# Patient Record
Sex: Male | Born: 1980 | Race: Black or African American | Hispanic: No | Marital: Single | Smoking: Former smoker
Health system: Southern US, Community
[De-identification: ages and names within clinical notes are randomized; demographics above are authoritative.]

## PROBLEM LIST (undated history)

## (undated) DIAGNOSIS — E119 Type 2 diabetes mellitus without complications: Secondary | ICD-10-CM

## (undated) DIAGNOSIS — E785 Hyperlipidemia, unspecified: Secondary | ICD-10-CM

## (undated) HISTORY — DX: Type 2 diabetes mellitus without complications: E11.9

## (undated) HISTORY — DX: Hyperlipidemia, unspecified: E78.5

---

## 2003-09-01 ENCOUNTER — Emergency Department (HOSPITAL_COMMUNITY): Admission: EM | Admit: 2003-09-01 | Discharge: 2003-09-02 | Payer: Self-pay | Admitting: Emergency Medicine

## 2012-10-06 ENCOUNTER — Ambulatory Visit (INDEPENDENT_AMBULATORY_CARE_PROVIDER_SITE_OTHER): Payer: BC Managed Care – PPO | Admitting: Family Medicine

## 2012-10-06 VITALS — BP 155/84 | HR 86 | Temp 97.9°F | Resp 16 | Ht 73.58 in | Wt 211.4 lb

## 2012-10-06 DIAGNOSIS — Z0000 Encounter for general adult medical examination without abnormal findings: Secondary | ICD-10-CM

## 2012-10-06 LAB — POCT CBC
Granulocyte percent: 44.7 %G (ref 37–80)
HCT, POC: 45.6 % (ref 43.5–53.7)
Hemoglobin: 14.6 g/dL (ref 14.1–18.1)
Lymph, poc: 3.6 — AB (ref 0.6–3.4)
MCH, POC: 27.5 pg (ref 27–31.2)
MCHC: 32 g/dL (ref 31.8–35.4)
MCV: 86.1 fL (ref 80–97)
MID (cbc): 0.5 (ref 0–0.9)
MPV: 9.7 fL (ref 0–99.8)
POC Granulocyte: 3.3 (ref 2–6.9)
POC LYMPH PERCENT: 49.2 %L (ref 10–50)
POC MID %: 6.1 %M (ref 0–12)
Platelet Count, POC: 277 10*3/uL (ref 142–424)
RBC: 5.3 M/uL (ref 4.69–6.13)
RDW, POC: 16.5 %
WBC: 7.4 10*3/uL (ref 4.6–10.2)

## 2012-10-06 NOTE — Progress Notes (Signed)
  Subjective:    Patient ID: Adrian Mcdaniel, male    DOB: September 21, 1981, 32 y.o.   MRN: [id number] Chief Complaint  Patient presents with  . Annual Exam    with dot pe    HPI  On doxy for scalp folliculitis from dermatologist in [place]. Fasting since breakfast.  No other pmhx No pshx No famhx. No cad, cva, cancre.    Review of Systems    118/88 L arm, 132/86 in Rt arm with large cuff BP 155/84  Pulse 86  Temp(Src) 97.9 F (36.6 C) (Oral)  Resp 16  Ht 6' 1.58" (1.869 m)  Wt 211 lb 6.4 oz (95.89 kg)  BMI 27.45 kg/m2  SpO2 98% Objective:   Physical Exam        Results for orders placed in visit on 10/06/12  POCT CBC      Component Value Range   WBC 7.4  4.6 - 10.2 K/uL   Lymph, poc 3.6 (*) 0.6 - 3.4   POC LYMPH PERCENT 49.2  10 - 50 %L   MID (cbc) 0.5  0 - 0.9   POC MID % 6.1  0 - 12 %M   POC Granulocyte 3.3  2 - 6.9   Granulocyte percent 44.7  37 - 80 %G   RBC 5.30  4.69 - 6.13 M/uL   Hemoglobin 14.6  14.1 - 18.1 g/dL   HCT, POC 40.9  81.1 - 53.7 %   MCV 86.1  80 - 97 fL   MCH, POC 27.5  27 - 31.2 pg   MCHC 32.0  31.8 - 35.4 g/dL   RDW, POC 91.4     Platelet Count, POC 277  142 - 424 K/uL   MPV 9.7  0 - 99.8 fL    Assessment & Plan:  Routine general medical examination at a health care facility - Plan: POCT CBC, Comprehensive metabolic panel, Lipid panel  Meds ordered this encounter  Medications  . Multiple Vitamin (ONE-A-DAY MENS PO)    Sig: Take 1 tablet by mouth daily.  Marland Kitchen doxycycline (DORYX) 100 MG EC tablet    Sig: Take 100 mg by mouth daily.

## 2012-10-07 LAB — COMPREHENSIVE METABOLIC PANEL
ALT: 29 U/L (ref 0–53)
AST: 25 U/L (ref 0–37)
Albumin: 5.2 g/dL (ref 3.5–5.2)
Alkaline Phosphatase: 57 U/L (ref 39–117)
BUN: 13 mg/dL (ref 6–23)
CO2: 29 mEq/L (ref 19–32)
Calcium: 10 mg/dL (ref 8.4–10.5)
Chloride: 100 mEq/L (ref 96–112)
Creat: 1.01 mg/dL (ref 0.50–1.35)
Glucose, Bld: 86 mg/dL (ref 70–99)
Potassium: 4 mEq/L (ref 3.5–5.3)
Sodium: 138 mEq/L (ref 135–145)
Total Bilirubin: 0.5 mg/dL (ref 0.3–1.2)
Total Protein: 8.1 g/dL (ref 6.0–8.3)

## 2012-10-07 LAB — LIPID PANEL
Cholesterol: 347 mg/dL — ABNORMAL HIGH (ref 0–200)
HDL: 48 mg/dL (ref >39)
LDL Cholesterol: 261 mg/dL — ABNORMAL HIGH (ref 0–99)
Total CHOL/HDL Ratio: 7.2 Ratio
Triglycerides: 192 mg/dL — ABNORMAL HIGH (ref <150)
VLDL: 38 mg/dL (ref 0–40)

## 2012-10-08 ENCOUNTER — Other Ambulatory Visit: Payer: Self-pay | Admitting: Family Medicine

## 2012-10-08 DIAGNOSIS — E785 Hyperlipidemia, unspecified: Secondary | ICD-10-CM

## 2012-10-08 MED ORDER — PRAVASTATIN SODIUM 40 MG PO TABS: 40.0000 mg | ORAL_TABLET | Freq: Every day | ORAL | Status: DC

## 2012-12-04 ENCOUNTER — Ambulatory Visit: Payer: BC Managed Care – PPO

## 2012-12-04 ENCOUNTER — Ambulatory Visit (INDEPENDENT_AMBULATORY_CARE_PROVIDER_SITE_OTHER): Payer: BC Managed Care – PPO | Admitting: Family Medicine

## 2012-12-04 VITALS — BP 119/80 | HR 107 | Temp 98.6°F | Resp 20 | Ht 75.0 in | Wt 199.2 lb

## 2012-12-04 DIAGNOSIS — R110 Nausea: Secondary | ICD-10-CM

## 2012-12-04 DIAGNOSIS — A09 Infectious gastroenteritis and colitis, unspecified: Secondary | ICD-10-CM

## 2012-12-04 DIAGNOSIS — R197 Diarrhea, unspecified: Secondary | ICD-10-CM

## 2012-12-04 DIAGNOSIS — R1013 Epigastric pain: Secondary | ICD-10-CM

## 2012-12-04 LAB — POCT URINALYSIS DIPSTICK
Bilirubin, UA: NEGATIVE
Glucose, UA: NEGATIVE
Ketones, UA: NEGATIVE
Leukocytes, UA: NEGATIVE
Nitrite, UA: NEGATIVE
Protein, UA: 30
Spec Grav, UA: >=1.03
Urobilinogen, UA: 0.2
pH, UA: 5.5

## 2012-12-04 LAB — POCT CBC
Granulocyte percent: 70.1 %G (ref 37–80)
HCT, POC: 45 % (ref 43.5–53.7)
Hemoglobin: 14.7 g/dL (ref 14.1–18.1)
Lymph, poc: 2 (ref 0.6–3.4)
MCH, POC: 27.8 pg (ref 27–31.2)
MCHC: 32.7 g/dL (ref 31.8–35.4)
MCV: 85.2 fL (ref 80–97)
MID (cbc): 0.5 (ref 0–0.9)
MPV: 8.9 fL (ref 0–99.8)
POC Granulocyte: 5.7 (ref 2–6.9)
POC LYMPH PERCENT: 24.1 %L (ref 10–50)
POC MID %: 5.8 %M (ref 0–12)
Platelet Count, POC: 255 10*3/uL (ref 142–424)
RBC: 5.28 M/uL (ref 4.69–6.13)
RDW, POC: 15.4 %
WBC: 8.2 10*3/uL (ref 4.6–10.2)

## 2012-12-04 LAB — POCT UA - MICROSCOPIC ONLY
Casts, Ur, LPF, POC: NEGATIVE
Crystals, Ur, HPF, POC: NEGATIVE
Epithelial cells, urine per micros: NEGATIVE
Mucus, UA: POSITIVE
Yeast, UA: NEGATIVE

## 2012-12-04 MED ORDER — CIPROFLOXACIN HCL 500 MG PO TABS: 500.0000 mg | ORAL_TABLET | Freq: Two times a day (BID) | ORAL | Status: DC

## 2012-12-04 MED ORDER — METRONIDAZOLE 500 MG PO TABS: 500.0000 mg | ORAL_TABLET | Freq: Three times a day (TID) | ORAL | Status: DC

## 2012-12-04 MED ORDER — ONDANSETRON 4 MG PO TBDP
8.0000 mg | ORAL_TABLET | Freq: Once | ORAL | Status: AC
  Administered 2012-12-04: 8 mg via ORAL

## 2012-12-04 MED ORDER — PROMETHAZINE HCL 25 MG PO TABS: 25.0000 mg | ORAL_TABLET | Freq: Four times a day (QID) | ORAL | Status: DC | PRN

## 2012-12-04 NOTE — Progress Notes (Signed)
Subjective:    Patient ID: Adrian Mcdaniel, male    DOB: 1981/07/19, 32 y.o.   MRN: [id number]  HPI  Sxs started with back/body aches.  Felt feverish on 3d, today lightheaded and dizzy.  Then abd pain and diarrhea started. Nuaseated so not tol po at all.  Even when he drinks, he just feels the water hit his stomach and sit there - makes him feel worse so hasn't drank anything today.  Tried advil and imodium but couldn't tolerate them and didn't seem to help.  History reviewed. No pertinent past medical history. Current Outpatient Prescriptions on File Prior to Visit  Medication Sig Dispense Refill  . doxycycline (DORYX) 100 MG EC tablet Take 100 mg by mouth daily.      . Multiple Vitamin (ONE-A-DAY MENS PO) Take 1 tablet by mouth daily.      . pravastatin (PRAVACHOL) 40 MG tablet Take 1 tablet (40 mg total) by mouth daily.  90 tablet  1   No current facility-administered medications on file prior to visit.   No Known Allergies   Review of Systems  Constitutional: Positive for fever, chills, diaphoresis, activity change, appetite change and fatigue.  Respiratory: Negative for shortness of breath.   Cardiovascular: Negative for chest pain.  Gastrointestinal: Positive for nausea, abdominal pain, diarrhea and abdominal distention. Negative for vomiting, constipation, blood in stool, anal bleeding and rectal pain.  Genitourinary: Positive for decreased urine volume. Negative for dysuria and frequency.  Musculoskeletal: Positive for myalgias, back pain and arthralgias. Negative for joint swelling and gait problem.  Skin: Negative for rash.  Neurological: Positive for dizziness, light-headedness and headaches.  Hematological: Negative for adenopathy.  Psychiatric/Behavioral: Positive for sleep disturbance.      BP 119/80  Pulse 107  Temp(Src) 98.6 F (37 C) (Oral)  Resp 20  Ht 6\' 3"  (1.905 m)  Wt 199 lb 3.2 oz (90.357 kg)  BMI 24.9 kg/m2  SpO2 99% Objective:   Physical Exam   Constitutional: He appears well-developed and well-nourished. No distress.  HENT:  Head: Normocephalic and atraumatic.  Neck: Normal range of motion. Neck supple. No thyromegaly present.  Cardiovascular: Normal rate, regular rhythm and normal heart sounds.   Pulmonary/Chest: Effort normal and breath sounds normal.  Abdominal: He exhibits distension. He exhibits no mass. Bowel sounds are increased. There is no hepatosplenomegaly. There is generalized tenderness. There is no rigidity, no rebound, no guarding, no CVA tenderness, no tenderness at McBurney's point and negative Murphy's sign. No hernia.  Genitourinary: Rectum normal and prostate normal. Rectal exam shows no tenderness and anal tone normal. Guaiac negative stool.  Lymphadenopathy:    He has no cervical adenopathy.  Skin: He is not diaphoretic.      Results for orders placed in visit on 12/04/12  POCT UA - MICROSCOPIC ONLY      Result Value Range   WBC, Ur, HPF, POC 4-8     RBC, urine, microscopic 9-12     Bacteria, U Microscopic trace     Mucus, UA pos     Epithelial cells, urine per micros neg     Crystals, Ur, HPF, POC neg     Casts, Ur, LPF, POC neg     Yeast, UA neg    POCT URINALYSIS DIPSTICK      Result Value Range   Color, UA yellow     Clarity, UA clear     Glucose, UA neg     Bilirubin, UA neg     Ketones, UA  neg     Spec Grav, UA >=1.030     Blood, UA mod     pH, UA 5.5     Protein, UA 30     Urobilinogen, UA 0.2     Nitrite, UA neg     Leukocytes, UA Negative    POCT CBC      Result Value Range   WBC 8.2  4.6 - 10.2 K/uL   Lymph, poc 2.0  0.6 - 3.4   POC LYMPH PERCENT 24.1  10 - 50 %L   MID (cbc) 0.5  0 - 0.9   POC MID % 5.8  0 - 12 %M   POC Granulocyte 5.7  2 - 6.9   Granulocyte percent 70.1  37 - 80 %G   RBC 5.28  4.69 - 6.13 M/uL   Hemoglobin 14.7  14.1 - 18.1 g/dL   HCT, POC 14.7  82.9 - 53.7 %   MCV 85.2  80 - 97 fL   MCH, POC 27.8  27 - 31.2 pg   MCHC 32.7  31.8 - 35.4 g/dL   RDW, POC  56.2     Platelet Count, POC 255  142 - 424 K/uL   MPV 8.9  0 - 99.8 fL   [facility] reading (PRIMARY) by  Dr. Clelia Croft. Multiple air fluid levels and mildly distended bowel.  Assessment & Plan:  Diarrhea - Plan: DG Abd 2 Views  Nausea alone - Plan: DG Abd 2 Views, ondansetron (ZOFRAN-ODT) disintegrating tablet 8 mg, promethazine (PHENERGAN) 25 MG tablet - gave pt zofran 8mg  SL x 1 in office after which he was able to keep down some water. Importance of hydration and pushing fluids reinforced. He did not vomit up the water but it did make him feel more bloated.  If he is unable to push fluids, rec RTC for IVF.  If he gets any worse - RTC for further eval.  Gave warning s/sxs.  Abdominal pain, epigastric - Plan: DG Abd 2 Views, POCT UA - Microscopic Only, POCT urinalysis dipstick, POCT CBC, Comprehensive metabolic panel, Lipase  Gastroenteritis/colitis, infectious  Started on pravastatin 2 mos ago so check lfts - if elevated, likely due to acute illness so will just recheck.  Meds ordered this encounter  Medications  . ondansetron (ZOFRAN-ODT) disintegrating tablet 8 mg    Sig:   . promethazine (PHENERGAN) 25 MG tablet    Sig: Take 1 tablet (25 mg total) by mouth every 6 (six) hours as needed for nausea.    Dispense:  30 tablet    Refill:  0  . ciprofloxacin (CIPRO) 500 MG tablet    Sig: Take 1 tablet (500 mg total) by mouth 2 (two) times daily.    Dispense:  14 tablet    Refill:  0  . metroNIDAZOLE (FLAGYL) 500 MG tablet    Sig: Take 1 tablet (500 mg total) by mouth 3 (three) times daily. DO NOT CONSUME ALCOHOL WHILE TAKING THIS MEDICATION.    Dispense:  14 tablet    Refill:  0

## 2012-12-04 NOTE — Patient Instructions (Signed)
Colitis Colitis is inflammation of the colon. Colitis can be a short-term or long-standing (chronic) illness.  CAUSES  There are many different causes of colitis, including:  Viruses.  Germs (bacteria).  Medicine reactions. SYMPTOMS   Diarrhea.  Intestinal bleeding.  Pain.  Fever.  Throwing up (vomiting).  Tiredness (fatigue).  Weight loss.  Bowel blockage. DIAGNOSIS  The diagnosis of colitis is based on examination and stool or blood tests. X-rays, CT scan, and colonoscopy may also be needed. TREATMENT  Treatment may include:  Fluids given through the vein (intravenously).  Bowel rest (nothing to eat or drink for a period of time).  Medicine for pain and diarrhea.  Medicines (antibiotics) that kill germs.  Cortisone medicines.  Surgery. HOME CARE INSTRUCTIONS   Get plenty of rest.  Drink enough water and fluids to keep your urine clear or pale yellow.  Eat a well-balanced diet.  Call your caregiver for follow-up as recommended. SEEK IMMEDIATE MEDICAL CARE IF:   You develop chills.  You have an oral temperature above 102 F (38.9 C), not controlled by medicine.  You have extreme weakness, fainting, or dehydration.  You have repeated vomiting.  You develop severe belly (abdominal) pain or are passing bloody or tarry stools. MAKE SURE YOU:   Understand these instructions.  Will watch your condition.  Will get help right away if you are not doing well or get worse. Document Released: 10/23/2004 Document Revised: 12/08/2011 Document Reviewed: 01/18/2010 [facility] Patient Information 2013 [place], [place]. Viral Gastroenteritis Viral gastroenteritis is also known as stomach flu. This condition affects the stomach and intestinal tract. It can cause sudden diarrhea and vomiting. The illness typically lasts 3 to 8 days. Most people develop an immune response that eventually gets rid of the virus. While this natural response develops, the virus can make  you quite ill. CAUSES  Many different viruses can cause gastroenteritis, such as rotavirus or noroviruses. You can catch one of these viruses by consuming contaminated food or water. You may also catch a virus by sharing utensils or other personal items with an infected person or by touching a contaminated surface. SYMPTOMS  The most common symptoms are diarrhea and vomiting. These problems can cause a severe loss of body fluids (dehydration) and a body salt (electrolyte) imbalance. Other symptoms may include:  Fever.  Headache.  Fatigue.  Abdominal pain. DIAGNOSIS  Your caregiver can usually diagnose viral gastroenteritis based on your symptoms and a physical exam. A stool sample may also be taken to test for the presence of viruses or other infections. TREATMENT  This illness typically goes away on its own. Treatments are aimed at rehydration. The most serious cases of viral gastroenteritis involve vomiting so severely that you are not able to keep fluids down. In these cases, fluids must be given through an intravenous line (IV). HOME CARE INSTRUCTIONS   Drink enough fluids to keep your urine clear or pale yellow. Drink small amounts of fluids frequently and increase the amounts as tolerated.  Ask your caregiver for specific rehydration instructions.  Avoid:  Foods high in sugar.  Alcohol.  Carbonated drinks.  Tobacco.  Juice.  Caffeine drinks.  Extremely hot or cold fluids.  Fatty, greasy foods.  Too much intake of anything at one time.  Dairy products until 24 to 48 hours after diarrhea stops.  You may consume probiotics. Probiotics are active cultures of beneficial bacteria. They may lessen the amount and number of diarrheal stools in adults. Probiotics can be found in yogurt  with active cultures and in supplements.  Wash your hands well to avoid spreading the virus.  Only take over-the-counter or prescription medicines for pain, discomfort, or fever as directed  by your caregiver. Do not give aspirin to children. Antidiarrheal medicines are not recommended.  Ask your caregiver if you should continue to take your regular prescribed and over-the-counter medicines.  Keep all follow-up appointments as directed by your caregiver. SEEK IMMEDIATE MEDICAL CARE IF:   You are unable to keep fluids down.  You do not urinate at least once every 6 to 8 hours.  You develop shortness of breath.  You notice blood in your stool or vomit. This may look like coffee grounds.  You have abdominal pain that increases or is concentrated in one small area (localized).  You have persistent vomiting or diarrhea.  You have a fever.  The patient is a child younger than 3 months, and he or she has a fever.  The patient is a child older than 3 months, and he or she has a fever and persistent symptoms.  The patient is a child older than 3 months, and he or she has a fever and symptoms suddenly get worse.  The patient is a baby, and he or she has no tears when crying. MAKE SURE YOU:   Understand these instructions.  Will watch your condition.  Will get help right away if you are not doing well or get worse. Document Released: 09/15/2005 Document Revised: 12/08/2011 Document Reviewed: 07/02/2011 [facility] Patient Information 2013 [place], [place].

## 2012-12-06 LAB — COMPREHENSIVE METABOLIC PANEL
ALT: 43 U/L (ref 0–53)
AST: 32 U/L (ref 0–37)
Albumin: 5.3 g/dL — ABNORMAL HIGH (ref 3.5–5.2)
Alkaline Phosphatase: 55 U/L (ref 39–117)
BUN: 12 mg/dL (ref 6–23)
CO2: 26 mEq/L (ref 19–32)
Calcium: 10.1 mg/dL (ref 8.4–10.5)
Chloride: 102 mEq/L (ref 96–112)
Creat: 1.13 mg/dL (ref 0.50–1.35)
Glucose, Bld: 102 mg/dL — ABNORMAL HIGH (ref 70–99)
Potassium: 4.4 mEq/L (ref 3.5–5.3)
Sodium: 140 mEq/L (ref 135–145)
Total Bilirubin: 0.4 mg/dL (ref 0.3–1.2)
Total Protein: 8.2 g/dL (ref 6.0–8.3)

## 2012-12-06 LAB — LIPASE: Lipase: 33 U/L (ref 0–75)

## 2013-09-08 ENCOUNTER — Ambulatory Visit (INDEPENDENT_AMBULATORY_CARE_PROVIDER_SITE_OTHER): Payer: BC Managed Care – PPO | Admitting: Family Medicine

## 2013-09-08 VITALS — BP 120/84 | HR 78 | Temp 98.8°F | Resp 16 | Ht 74.0 in | Wt 194.8 lb

## 2013-09-08 DIAGNOSIS — R682 Dry mouth, unspecified: Secondary | ICD-10-CM

## 2013-09-08 DIAGNOSIS — K117 Disturbances of salivary secretion: Secondary | ICD-10-CM

## 2013-09-08 DIAGNOSIS — E119 Type 2 diabetes mellitus without complications: Secondary | ICD-10-CM

## 2013-09-08 DIAGNOSIS — R631 Polydipsia: Secondary | ICD-10-CM

## 2013-09-08 DIAGNOSIS — IMO0001 Reserved for inherently not codable concepts without codable children: Secondary | ICD-10-CM

## 2013-09-08 DIAGNOSIS — H538 Other visual disturbances: Secondary | ICD-10-CM

## 2013-09-08 LAB — POCT UA - MICROSCOPIC ONLY
Bacteria, U Microscopic: NEGATIVE
Casts, Ur, LPF, POC: NEGATIVE
Crystals, Ur, HPF, POC: NEGATIVE
Mucus, UA: NEGATIVE
WBC, Ur, HPF, POC: NEGATIVE
Yeast, UA: NEGATIVE

## 2013-09-08 LAB — POCT URINALYSIS DIPSTICK
Bilirubin, UA: NEGATIVE
Glucose, UA: 500
Ketones, UA: 80
Leukocytes, UA: NEGATIVE
Nitrite, UA: NEGATIVE
Protein, UA: 30
Spec Grav, UA: 1.02
Urobilinogen, UA: 0.2
pH, UA: 5

## 2013-09-08 LAB — POCT GLYCOSYLATED HEMOGLOBIN (HGB A1C): Hemoglobin A1C: 12.3

## 2013-09-08 LAB — GLUCOSE, POCT (MANUAL RESULT ENTRY): POC Glucose: 270 mg/dl — AB (ref 70–99)

## 2013-09-08 MED ORDER — BLOOD GLUCOSE METER KIT: PACK | Status: AC

## 2013-09-08 MED ORDER — GLIMEPIRIDE 2 MG PO TABS: 2.0000 mg | ORAL_TABLET | Freq: Every day | ORAL | Status: DC

## 2013-09-08 MED ORDER — METFORMIN HCL 500 MG PO TABS: ORAL_TABLET | ORAL | Status: DC

## 2013-09-08 NOTE — Patient Instructions (Addendum)
Attend diabetic classes  Read as much as she can on the American Diabetic Association website  Http://www.diabetes.org/  Take metformin 500 mg twice daily for 2 days, then 1000 mg twice daily at breakfast and supper.  If it causes abdominal pain or diarrhea may have to progress dose slower.  Take the glimepiride 2 mg twice daily for blood sugar  Drink plenty of fluids  Record your blood sugars. Check them before breakfast and before supper. If necessary do one before bedtime. If your sugar falls to low use sometimes break out in a sweat and feel extremely weak suddenly. Eat something sweet if you must to try and get your sugar back up at those times. I don't think that will happen, but it can. He is going to try and check your blood sugar at that time if needed so you can document that it went too low. Below 80 can get dangers in a diabetic. My goal for now is to see you get your blood sugars between 100-150 fasting, and less than 180 in the daytime.  Return Sunday or early Monday for a recheck.  Return immediately on return from [place]

## 2013-09-08 NOTE — Progress Notes (Signed)
Subjective: 32 year old male who for the last couple of weeks been having increased thirst and frequency of urination. He feels very fatigued. There is a strong family history of diabetes with both parents being diabetic. He works as a Recruitment consultant for [employer]. No dysuria. No fevers. He does have some slight headaches once in a while.  Objective: Healthy-appearing young man. His weight is down 5 pounds in the last 9 months. His throat is clear, mucous membranes moist. Neck supple without nodes. Chest clear. Heart regular without murmurs. Abdomen soft and nontender. Skin turgor good.  Assessment: Polydipsia Thirst Polyuria  Plan: Glucose and hemoglobin A1c U/a  Results for orders placed in visit on 09/08/13  GLUCOSE, POCT (MANUAL RESULT ENTRY)      Result Value Range   POC Glucose 270 (*) 70 - 99 mg/dl  POCT GLYCOSYLATED HEMOGLOBIN (HGB A1C)      Result Value Range   Hemoglobin A1C 12.3    POCT UA - MICROSCOPIC ONLY      Result Value Range   WBC, Ur, HPF, POC Neg     RBC, urine, microscopic 0-1     Bacteria, U Microscopic Neg     Mucus, UA Neg     Epithelial cells, urine per micros 0-1     Crystals, Ur, HPF, POC neg     Casts, Ur, LPF, POC neg     Yeast, UA neg    POCT URINALYSIS DIPSTICK      Result Value Range   Color, UA Yellow     Clarity, UA clear     Glucose, UA 500     Bilirubin, UA Neg     Ketones, UA 80     Spec Grav, UA 1.020     Blood, UA Trace     pH, UA 5.0     Protein, UA 30     Urobilinogen, UA 0.2     Nitrite, UA neg     Leukocytes, UA Negative     Assessment: New-onset type 2 diabetes poorly controlled  Plan: See the instructions. Will start him on metformin and Amaryl. He is to come back in 3 days although I will not be here. [place] is going out of the [place] on Monday which makes things much more complicated. I want to be sure certain that his sugars are coming down.

## 2013-09-12 ENCOUNTER — Ambulatory Visit (INDEPENDENT_AMBULATORY_CARE_PROVIDER_SITE_OTHER): Payer: BC Managed Care – PPO | Admitting: Family Medicine

## 2013-09-12 VITALS — BP 134/82 | HR 86 | Temp 98.1°F | Resp 16 | Ht 73.25 in | Wt 194.0 lb

## 2013-09-12 DIAGNOSIS — R809 Proteinuria, unspecified: Secondary | ICD-10-CM

## 2013-09-12 DIAGNOSIS — E1165 Type 2 diabetes mellitus with hyperglycemia: Secondary | ICD-10-CM

## 2013-09-12 DIAGNOSIS — IMO0001 Reserved for inherently not codable concepts without codable children: Secondary | ICD-10-CM

## 2013-09-12 DIAGNOSIS — E785 Hyperlipidemia, unspecified: Secondary | ICD-10-CM

## 2013-09-12 DIAGNOSIS — IMO0002 Reserved for concepts with insufficient information to code with codable children: Secondary | ICD-10-CM

## 2013-09-12 LAB — COMPREHENSIVE METABOLIC PANEL
ALT: 16 U/L (ref 0–53)
AST: 20 U/L (ref 0–37)
Albumin: 5.1 g/dL (ref 3.5–5.2)
Alkaline Phosphatase: 73 U/L (ref 39–117)
BUN: 9 mg/dL (ref 6–23)
CO2: 25 mEq/L (ref 19–32)
Calcium: 10 mg/dL (ref 8.4–10.5)
Chloride: 100 mEq/L (ref 96–112)
Creat: 1.01 mg/dL (ref 0.50–1.35)
Glucose, Bld: 174 mg/dL — ABNORMAL HIGH (ref 70–99)
Potassium: 3.9 mEq/L (ref 3.5–5.3)
Sodium: 139 mEq/L (ref 135–145)
Total Bilirubin: 0.7 mg/dL (ref 0.3–1.2)
Total Protein: 7.8 g/dL (ref 6.0–8.3)

## 2013-09-12 LAB — LIPID PANEL
Cholesterol: 295 mg/dL — ABNORMAL HIGH (ref 0–200)
HDL: 45 mg/dL (ref >39)
LDL Cholesterol: 222 mg/dL — ABNORMAL HIGH (ref 0–99)
Total CHOL/HDL Ratio: 6.6 Ratio
Triglycerides: 139 mg/dL (ref <150)
VLDL: 28 mg/dL (ref 0–40)

## 2013-09-12 LAB — POCT URINALYSIS DIPSTICK
Bilirubin, UA: NEGATIVE
Glucose, UA: NEGATIVE
Ketones, UA: NEGATIVE
Leukocytes, UA: NEGATIVE
Nitrite, UA: NEGATIVE
Protein, UA: NEGATIVE
Spec Grav, UA: 1.01
Urobilinogen, UA: 0.2
pH, UA: 5.5

## 2013-09-12 LAB — POCT UA - MICROSCOPIC ONLY
Bacteria, U Microscopic: NEGATIVE
Casts, Ur, LPF, POC: NEGATIVE
Crystals, Ur, HPF, POC: NEGATIVE
Epithelial cells, urine per micros: NEGATIVE
Mucus, UA: NEGATIVE
RBC, urine, microscopic: NEGATIVE
WBC, Ur, HPF, POC: NEGATIVE
Yeast, UA: NEGATIVE

## 2013-09-12 LAB — TSH: TSH: 1.92 u[IU]/mL (ref 0.350–4.500)

## 2013-09-12 LAB — GLUCOSE, POCT (MANUAL RESULT ENTRY): POC Glucose: 153 mg/dl — AB (ref 70–99)

## 2013-09-12 MED ORDER — PRAVASTATIN SODIUM 40 MG PO TABS: 40.0000 mg | ORAL_TABLET | Freq: Every day | ORAL | Status: DC

## 2013-09-12 NOTE — Progress Notes (Deleted)
Chief Complaint:  Chief Complaint  Patient presents with  . Follow-up    Newly diagnosed diabetes    HPI: Adrian Mcdaniel is a 32 y.o. male who is here for DM check he was here last Thursday and was Diagnose with DM by Dr Alwyn Ren. Since then he has been on medicine he states that he's doing well on it. His highest reading was 334 on Saturday evening and the lowest was 174 on Friday. This morning he checked it reading at 224 around 7:30am. No numbness or tingling since he started the medicine. Patient states that he only gets up once at night now considering he was getting up around 7-8 times at night.  History reviewed. No pertinent past medical history. History reviewed. No pertinent past surgical history. History   Social History  . Marital Status: Single    Spouse Name: N/A    Number of Children: N/A  . Years of Education: N/A   Social History Main Topics  . Smoking status: Former Smoker    Start date: 10/06/2006  . Smokeless tobacco: None  . Alcohol Use: Yes  . Drug Use: No  . Sexual Activity: Yes   Other Topics Concern  . None   Social History Narrative  . None   History reviewed. No pertinent family history. No Known Allergies Prior to Admission medications   Medication Sig Start Date End Date Taking? Authorizing Provider  Blood Glucose Monitoring Suppl (BLOOD GLUCOSE METER) kit Use as instructed 09/08/13  Yes Peyton Najjar, MD  doxycycline (DORYX) 100 MG EC tablet Take 100 mg by mouth daily.   Yes Historical Provider, MD  glimepiride (AMARYL) 2 MG tablet Take 1 tablet (2 mg total) by mouth daily before breakfast. 09/08/13  Yes Peyton Najjar, MD  metFORMIN (GLUCOPHAGE) 500 MG tablet Take one twice daily, then moved upward to 2 twice daily as directed 09/08/13  Yes Peyton Najjar, MD  pravastatin (PRAVACHOL) 40 MG tablet Take 1 tablet (40 mg total) by mouth daily. 10/08/12   Sherren Mocha, MD  promethazine (PHENERGAN) 25 MG tablet Take 1 tablet (25 mg total)  by mouth every 6 (six) hours as needed for nausea. 12/04/12   Sherren Mocha, MD     ROS: The patient denies fevers, chills, night sweats, unintentional weight loss, chest pain, palpitations, wheezing, dyspnea on exertion, nausea, vomiting, abdominal pain, dysuria, hematuria, melena, numbness, weakness, or tingling. ***  All other systems have been reviewed and were otherwise negative with the exception of those mentioned in the HPI and as above.    PHYSICAL EXAM: Filed Vitals:   09/12/13 0857  BP: 134/82  Pulse: 86  Temp: 98.1 F (36.7 C)  Resp: 16   Filed Vitals:   09/12/13 0857  Height: 6' 1.25" (1.861 m)  Weight: 194 lb (87.998 kg)   Body mass index is 25.41 kg/(m^2).  General: Alert, no acute distress HEENT:  Normocephalic, atraumatic, oropharynx patent. EOMI, PERRLA Cardiovascular:  Regular rate and rhythm, no rubs murmurs or gallops.  No Carotid bruits, radial pulse intact. No pedal edema.  Respiratory: Clear to auscultation bilaterally.  No wheezes, rales, or rhonchi.  No cyanosis, no use of accessory musculature GI: No organomegaly, abdomen is soft and non-tender, positive bowel sounds.  No masses. Skin: No rashes. Neurologic: Facial musculature symmetric. Psychiatric: Patient is appropriate throughout our interaction. Lymphatic: No cervical lymphadenopathy Musculoskeletal: Gait intact.   LABS: Results for orders placed in visit on 09/08/13  GLUCOSE,  POCT (MANUAL RESULT ENTRY)      Result Value Range   POC Glucose 270 (*) 70 - 99 mg/dl  POCT GLYCOSYLATED HEMOGLOBIN (HGB A1C)      Result Value Range   Hemoglobin A1C 12.3    POCT UA - MICROSCOPIC ONLY      Result Value Range   WBC, Ur, HPF, POC Neg     RBC, urine, microscopic 0-1     Bacteria, U Microscopic Neg     Mucus, UA Neg     Epithelial cells, urine per micros 0-1     Crystals, Ur, HPF, POC neg     Casts, Ur, LPF, POC neg     Yeast, UA neg    POCT URINALYSIS DIPSTICK      Result Value Range   Color, UA  Yellow     Clarity, UA clear     Glucose, UA 500     Bilirubin, UA Neg     Ketones, UA 80     Spec Grav, UA 1.020     Blood, UA Trace     pH, UA 5.0     Protein, UA 30     Urobilinogen, UA 0.2     Nitrite, UA neg     Leukocytes, UA Negative       EKG/XRAY:   Primary read interpreted by Dr. Conley Rolls at [facility].   ASSESSMENT/PLAN: No diagnosis found.   Gross sideeffects, risk and benefits, and alternatives of medications d/w patient. Patient is aware that all medications have potential sideeffects and we are unable to predict every sideeffect or drug-drug interaction that may occur.  Gloriann Loan [facility] 09/12/2013 9:41 AM

## 2013-09-12 NOTE — Progress Notes (Signed)
 Chief Complaint:  Chief Complaint  Patient presents with  . Follow-up    Newly diagnosed diabetes    HPI: Adrian Mcdaniel is a 32 y.o. male who is here for DM check he was here last Thursday ( 5 days ago)  and was Diagnosed with DM by Dr Alwyn Ren. He was given Metformin 1 gram BID and also Amryl 2 mg daily. He initially had some diarrhea with the meds but once he was able to tolerate it, he advance his metformin. He is on the full dose of metformin. Since he has been on medicine he states that he's doing well on it. His highest fasting glucose  reading was 334 on Saturday evening and the lowest was 174 on Friday. This morning he checked it and fasting glucose was at 255 around 7:30am. Denies numbness or tingling. Prior to starting meds he had polydispia/polyuria .  Patient states that he only gets up once at night now considering he was getting up around 7-8 times at night prior to getting DM under control.  He was previously on cholesterol medicine but took himself off of it.    Past Medical History  Diagnosis Date  . Diabetes mellitus without complication   . Hyperlipidemia    History reviewed. No pertinent past surgical history. History   Social History  . Marital Status: Single    Spouse Name: N/A    Number of Children: N/A  . Years of Education: N/A   Social History Main Topics  . Smoking status: Former Smoker    Start date: 10/06/2006  . Smokeless tobacco: None  . Alcohol Use: Yes  . Drug Use: No  . Sexual Activity: Yes   Other Topics Concern  . None   Social History Narrative  . None   History reviewed. No pertinent family history. No Known Allergies Prior to Admission medications   Medication Sig Start Date End Date Taking? Authorizing Provider  Blood Glucose Monitoring Suppl (BLOOD GLUCOSE METER) kit Use as instructed 09/08/13  Yes Peyton Najjar, MD  glimepiride (AMARYL) 2 MG tablet Take 1 tablet (2 mg total) by mouth daily before breakfast. 09/08/13   Yes Peyton Najjar, MD  metFORMIN (GLUCOPHAGE) 500 MG tablet Take one twice daily, then moved upward to 2 twice daily as directed 09/08/13  Yes Peyton Najjar, MD  pravastatin (PRAVACHOL) 40 MG tablet Take 1 tablet (40 mg total) by mouth daily. 09/12/13    P , DO  promethazine (PHENERGAN) 25 MG tablet Take 1 tablet (25 mg total) by mouth every 6 (six) hours as needed for nausea. 12/04/12   Sherren Mocha, MD     ROS: The patient denies fevers, chills, night sweats, unintentional weight loss, chest pain, palpitations, wheezing, dyspnea on exertion, nausea, vomiting, abdominal pain, dysuria, hematuria, melena, numbness, weakness, or tingling.   All other systems have been reviewed and were otherwise negative with the exception of those mentioned in the HPI and as above.    PHYSICAL EXAM: Filed Vitals:   09/12/13 0857  BP: 134/82  Pulse: 86  Temp: 98.1 F (36.7 C)  Resp: 16   Filed Vitals:   09/12/13 0857  Height: 6' 1.25" (1.861 m)  Weight: 194 lb (87.998 kg)   Body mass index is 25.41 kg/(m^2).  General: Alert, no acute distress HEENT:  Normocephalic, atraumatic, oropharynx patent. EOMI, PERRLA Cardiovascular:  Regular rate and rhythm, no rubs murmurs or gallops.  No Carotid bruits, radial pulse intact. No pedal  edema.  Respiratory: Clear to auscultation bilaterally.  No wheezes, rales, or rhonchi.  No cyanosis, no use of accessory musculature GI: No organomegaly, abdomen is soft and non-tender, positive bowel sounds.  No masses. Skin: No rashes. Neurologic: Facial musculature symmetric. Psychiatric: Patient is appropriate throughout our interaction. Lymphatic: No cervical lymphadenopathy Musculoskeletal: Gait intact.   LABS: Results for orders placed in visit on 09/12/13  POCT UA - MICROSCOPIC ONLY      Result Value Range   WBC, Ur, HPF, POC neg     RBC, urine, microscopic neg     Bacteria, U Microscopic neg     Mucus, UA neg     Epithelial cells, urine per micros neg      Crystals, Ur, HPF, POC neg     Casts, Ur, LPF, POC neg     Yeast, UA neg    POCT URINALYSIS DIPSTICK      Result Value Range   Color, UA yellow     Clarity, UA clear     Glucose, UA neg     Bilirubin, UA neg     Ketones, UA neg     Spec Grav, UA 1.010     Blood, UA trace     pH, UA 5.5     Protein, UA neg     Urobilinogen, UA 0.2     Nitrite, UA neg     Leukocytes, UA Negative    GLUCOSE, POCT (MANUAL RESULT ENTRY)      Result Value Range   POC Glucose 153 (*) 70 - 99 mg/dl     EKG/XRAY:   Primary read interpreted by Dr. Conley Rolls at [facility].   ASSESSMENT/PLAN: Encounter Diagnoses  Name Primary?  . Hyperlipidemia Yes  . 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 Proteinuria   . Hyperlipidemia LDL goal < 160     Very nice 32 year old African male form [place] with new onset T2DM who is here for recheck-fasting glucose improving on metformin and also amaryl. He is leaving to go back home today. States that his trip will not be as fun since he has to watch what he eats due to DM, I told him at least he'll live longer to enjoy more trips to [place]. He was apparently not impressed with that logic.   Make appt in 3 months at appt clinic  DM recheck at that time Recommend : ADA diet, BP goal <140/90, daily foot exams, tobacco cessation if smoking, annual eye exam, annual flu vaccine, PNA vaccine if age and time appropriate.  Declined flu vaccine If still has protein in urine then need to start ACEI or ARB on next visit, he is is leaving to go to [place] today.  We will restart him on his pravastatin. He was on it prior for hyperlipidemia.  F/u in 3 months  Gross sideeffects, risk and benefits, and alternatives of medications d/w patient. Patient is aware that all medications have potential sideeffects and we are unable to predict every sideeffect or drug-drug interaction that may occur.  ,  PHUONG, DO 09/12/2013 11:05 AM

## 2013-09-12 NOTE — Progress Notes (Deleted)
Subjective:    Patient ID: Adrian Mcdaniel, male    DOB: 12-06-1980, 32 y.o.   MRN: [id number]  HPI      Chief Complaint:  Chief Complaint  Patient presents with  . Follow-up    Newly diagnosed diabetes    HPI: Adrian Mcdaniel is a 32 y.o. male who is here for DM check he was here last Thursday and was Diagnose with DM by Dr Alwyn Ren. Since then he has been on medicine he states that he's doing well on it. His highest reading was 334 on Saturday evening and the lowest was 174 on Friday. This morning he checked it reading at 224 around 7:30am. No numbness or tingling since he started the medicine. Patient states that he only gets up once at night now considering he was getting up around 7-8 times at night.  History reviewed. No pertinent past medical history. History reviewed. No pertinent past surgical history. History   Social History  . Marital Status: Single    Spouse Name: N/A    Number of Children: N/A  . Years of Education: N/A   Social History Main Topics  . Smoking status: Former Smoker    Start date: 10/06/2006  . Smokeless tobacco: None  . Alcohol Use: Yes  . Drug Use: No  . Sexual Activity: Yes   Other Topics Concern  . None   Social History Narrative  . None   History reviewed. No pertinent family history. No Known Allergies Prior to Admission medications   Medication Sig Start Date End Date Taking? Authorizing Provider  Blood Glucose Monitoring Suppl (BLOOD GLUCOSE METER) kit Use as instructed 09/08/13  Yes Peyton Najjar, MD  doxycycline (DORYX) 100 MG EC tablet Take 100 mg by mouth daily.   Yes Historical Provider, MD  glimepiride (AMARYL) 2 MG tablet Take 1 tablet (2 mg total) by mouth daily before breakfast. 09/08/13  Yes Peyton Najjar, MD  metFORMIN (GLUCOPHAGE) 500 MG tablet Take one twice daily, then moved upward to 2 twice daily as directed 09/08/13  Yes Peyton Najjar, MD  pravastatin (PRAVACHOL) 40 MG tablet Take 1 tablet (40 mg total)  by mouth daily. 10/08/12   Sherren Mocha, MD  promethazine (PHENERGAN) 25 MG tablet Take 1 tablet (25 mg total) by mouth every 6 (six) hours as needed for nausea. 12/04/12   Sherren Mocha, MD     ROS: The patient denies fevers, chills, night sweats, unintentional weight loss, chest pain, palpitations, wheezing, dyspnea on exertion, nausea, vomiting, abdominal pain, dysuria, hematuria, melena, numbness, weakness, or tingling. ***  All other systems have been reviewed and were otherwise negative with the exception of those mentioned in the HPI and as above.    PHYSICAL EXAM: Filed Vitals:   09/12/13 0857  BP: 134/82  Pulse: 86  Temp: 98.1 F (36.7 C)  Resp: 16   Filed Vitals:   09/12/13 0857  Height: 6' 1.25" (1.861 m)  Weight: 194 lb (87.998 kg)   Body mass index is 25.41 kg/(m^2).  General: Alert, no acute distress HEENT:  Normocephalic, atraumatic, oropharynx patent. EOMI, PERRLA Cardiovascular:  Regular rate and rhythm, no rubs murmurs or gallops.  No Carotid bruits, radial pulse intact. No pedal edema.  Respiratory: Clear to auscultation bilaterally.  No wheezes, rales, or rhonchi.  No cyanosis, no use of accessory musculature GI: No organomegaly, abdomen is soft and non-tender, positive bowel sounds.  No masses. Skin: No rashes. Neurologic: Facial musculature symmetric. Psychiatric: Patient is  appropriate throughout our interaction. Lymphatic: No cervical lymphadenopathy Musculoskeletal: Gait intact.   LABS: Results for orders placed in visit on 09/08/13  GLUCOSE, POCT (MANUAL RESULT ENTRY)      Result Value Range   POC Glucose 270 (*) 70 - 99 mg/dl  POCT GLYCOSYLATED HEMOGLOBIN (HGB A1C)      Result Value Range   Hemoglobin A1C 12.3    POCT UA - MICROSCOPIC ONLY      Result Value Range   WBC, Ur, HPF, POC Neg     RBC, urine, microscopic 0-1     Bacteria, U Microscopic Neg     Mucus, UA Neg     Epithelial cells, urine per micros 0-1     Crystals, Ur, HPF, POC neg      Casts, Ur, LPF, POC neg     Yeast, UA neg    POCT URINALYSIS DIPSTICK      Result Value Range   Color, UA Yellow     Clarity, UA clear     Glucose, UA 500     Bilirubin, UA Neg     Ketones, UA 80     Spec Grav, UA 1.020     Blood, UA Trace     pH, UA 5.0     Protein, UA 30     Urobilinogen, UA 0.2     Nitrite, UA neg     Leukocytes, UA Negative       EKG/XRAY:   Primary read interpreted by Dr. Conley Rolls at [facility].   ASSESSMENT/PLAN: No diagnosis found.   Gross sideeffects, risk and benefits, and alternatives of medications d/w patient. Patient is aware that all medications have potential sideeffects and we are unable to predict every sideeffect or drug-drug interaction that may occur.  Gloriann Loan [facility] 09/12/2013 9:41 AM     Review of Systems     Objective:   Physical Exam        Assessment & Plan:

## 2013-09-13 ENCOUNTER — Encounter: Payer: Self-pay | Admitting: Family Medicine

## 2013-09-13 DIAGNOSIS — E1165 Type 2 diabetes mellitus with hyperglycemia: Secondary | ICD-10-CM | POA: Insufficient documentation

## 2013-09-13 DIAGNOSIS — IMO0002 Reserved for concepts with insufficient information to code with codable children: Secondary | ICD-10-CM | POA: Insufficient documentation

## 2013-12-07 ENCOUNTER — Ambulatory Visit (INDEPENDENT_AMBULATORY_CARE_PROVIDER_SITE_OTHER): Payer: BC Managed Care – PPO | Admitting: Family Medicine

## 2013-12-07 VITALS — BP 108/72 | HR 75 | Temp 98.3°F | Resp 16 | Ht 74.5 in | Wt 210.0 lb

## 2013-12-07 DIAGNOSIS — E162 Hypoglycemia, unspecified: Secondary | ICD-10-CM

## 2013-12-07 DIAGNOSIS — E119 Type 2 diabetes mellitus without complications: Secondary | ICD-10-CM

## 2013-12-07 DIAGNOSIS — R531 Weakness: Secondary | ICD-10-CM

## 2013-12-07 DIAGNOSIS — E785 Hyperlipidemia, unspecified: Secondary | ICD-10-CM

## 2013-12-07 DIAGNOSIS — R5381 Other malaise: Secondary | ICD-10-CM

## 2013-12-07 DIAGNOSIS — R5383 Other fatigue: Secondary | ICD-10-CM

## 2013-12-07 LAB — POCT GLYCOSYLATED HEMOGLOBIN (HGB A1C): Hemoglobin A1C: 5.9

## 2013-12-07 LAB — GLUCOSE, POCT (MANUAL RESULT ENTRY): POC Glucose: 96 mg/dl (ref 70–99)

## 2013-12-07 NOTE — Patient Instructions (Signed)
Stopped taking the glimepiride  Continue the metformin  Continue to watch her diet and get the exercise  If you're energy level does not improve over the next week to let me know.  Return in 3 months, sooner if needed

## 2013-12-07 NOTE — Progress Notes (Signed)
Subjective: 33 year old male who is here for a followup with regard to his diabetes. He is doing okay, but doesn't feel like he has all of his energy. He just feels weak sometimes in the morning and sometimes through the day. He has not checked his sugar during those episodes. Most of the time his sugar runs in the 80s to a little over 100. He exercises for 2 hours in the evenings. He takes his medicines faithfully. He has some low episodes of feeling like his mind lapses.  Objective: Healthy-appearing man in no acute distress. Neck supple without nodes or thyromegaly. Chest clear. Heart regular without murmurs. Abdomen soft without mass tenderness.  Assessment: Diabetes mellitus Probable hypoglycemic spells Weakness Hyperlipidemia  Plan: Hemoglobin A1c, glucose, C. met, and lipids  Results for orders placed in visit on 12/07/13  POCT GLYCOSYLATED HEMOGLOBIN (HGB A1C)      Result Value Ref Range   Hemoglobin A1C 5.9    GLUCOSE, POCT (MANUAL RESULT ENTRY)      Result Value Ref Range   POC Glucose 96  70 - 99 mg/dl   Assessment: Diabetes excessively well controlled Probable hypoglycemic spells causing weakness  Plan: Stopped the glimepiride. Continue other medications. Return in 3 months or sooner if needed.

## 2013-12-08 LAB — COMPLETE METABOLIC PANEL WITH GFR
ALT: 36 U/L (ref 0–53)
AST: 29 U/L (ref 0–37)
Albumin: 5.1 g/dL (ref 3.5–5.2)
Alkaline Phosphatase: 46 U/L (ref 39–117)
BUN: 9 mg/dL (ref 6–23)
CO2: 28 mEq/L (ref 19–32)
Calcium: 9.7 mg/dL (ref 8.4–10.5)
Chloride: 101 mEq/L (ref 96–112)
Creat: 0.96 mg/dL (ref 0.50–1.35)
GFR, Est African American: >89 mL/min
GFR, Est Non African American: >89 mL/min
Glucose, Bld: 99 mg/dL (ref 70–99)
Potassium: 4.5 mEq/L (ref 3.5–5.3)
Sodium: 139 mEq/L (ref 135–145)
Total Bilirubin: 0.5 mg/dL (ref 0.2–1.2)
Total Protein: 7.6 g/dL (ref 6.0–8.3)

## 2013-12-08 LAB — LIPID PANEL
Cholesterol: 203 mg/dL — ABNORMAL HIGH (ref 0–200)
HDL: 38 mg/dL — ABNORMAL LOW (ref >39)
LDL Cholesterol: 136 mg/dL — ABNORMAL HIGH (ref 0–99)
Total CHOL/HDL Ratio: 5.3 Ratio
Triglycerides: 147 mg/dL (ref <150)
VLDL: 29 mg/dL (ref 0–40)

## 2013-12-09 ENCOUNTER — Encounter: Payer: Self-pay | Admitting: Family Medicine

## 2014-03-11 IMAGING — CR DG ABDOMEN 2V
3 series · 3 of 3 positions shown · non-contrast
Comparison: None.

CLINICAL DATA: Bloating, tympanic bowel sounds, nausea and vomiting

ABDOMEN - 2 VIEW

[AP (1 of 3)]
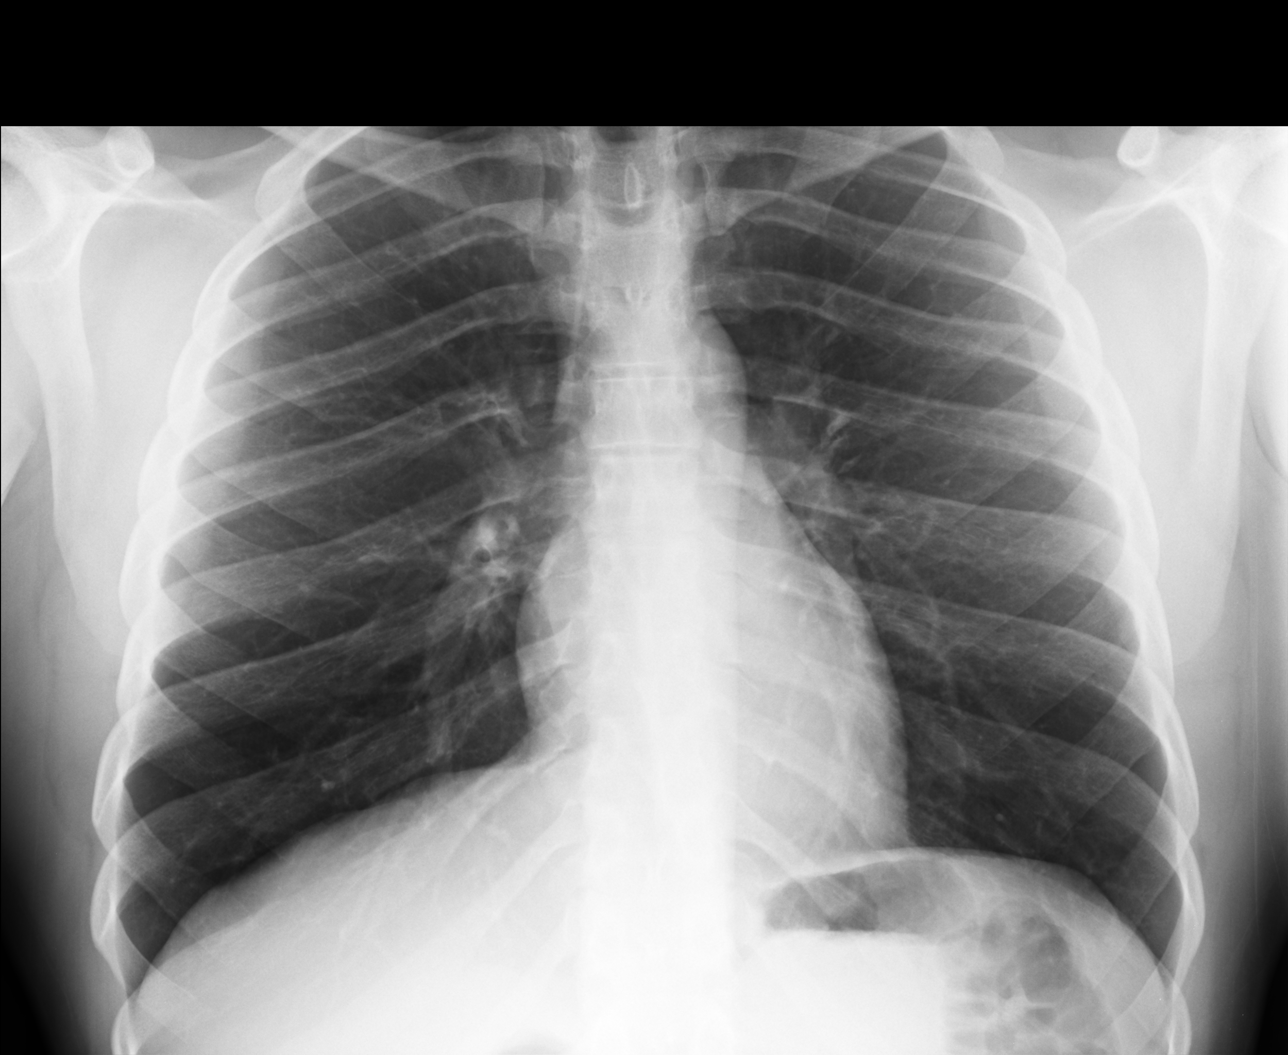

[AP (2 of 3)]
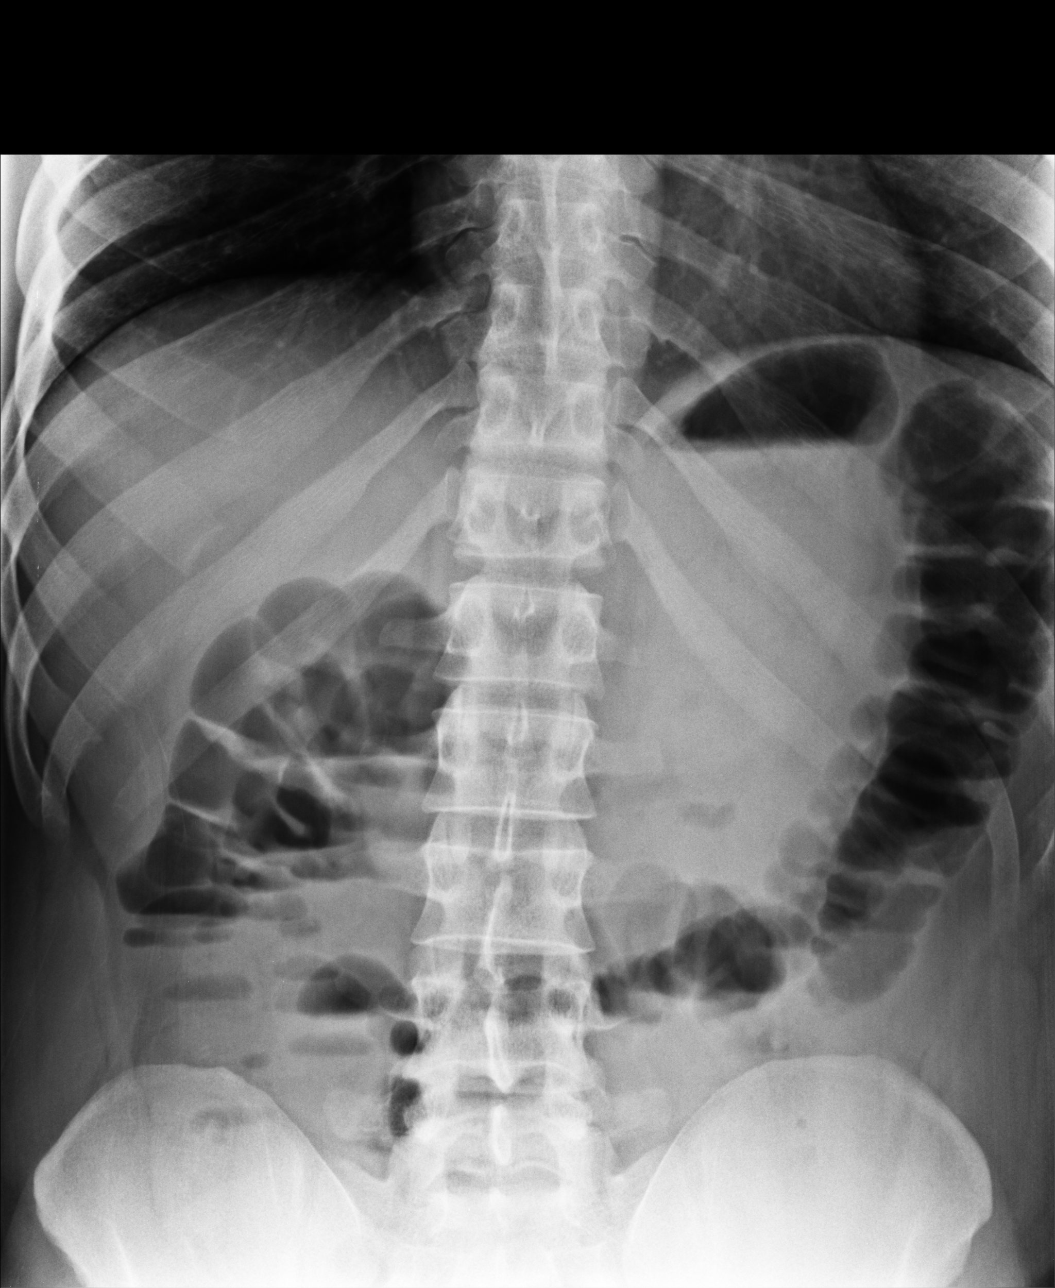

[AP (3 of 3)]
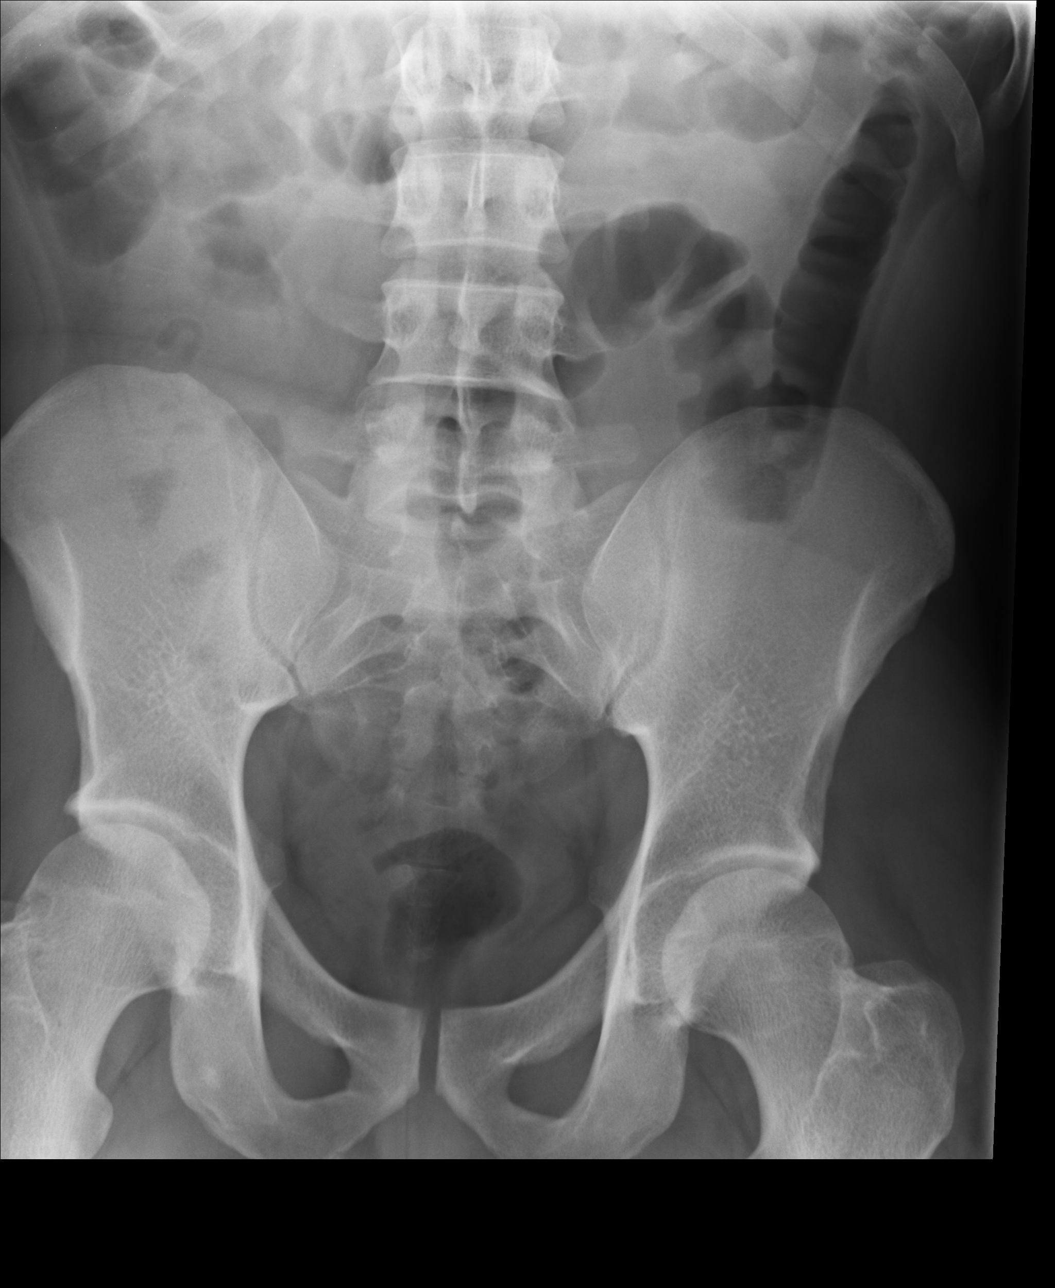

[3 of 3 positions shown; findings below may reference images not displayed]

FINDINGS: The lungs are clear well-aerated.  Normal cardiac and
mediastinal contours.  No acute osseous abnormality.  Nonspecific,
nonobstructive bowel gas pattern.  Gas is noted throughout the
colon to the level of the rectum.  There are multiple small air
fluid levels within the colon on the upright image.  No free air.
IMPRESSION: 1.  No acute cardiopulmonary disease.

2. Nonspecific but nonobstructed bowel gas pattern.  Gas is noted
throughout the colon to the level of the rectum.  There are
multiple air-fluid levels within the colon.

## 2014-04-12 ENCOUNTER — Telehealth: Payer: Self-pay | Admitting: *Deleted

## 2014-04-12 NOTE — Telephone Encounter (Signed)
Called patient to schedule an appointment for Diabetes maintenance with Dr Clelia CroftShaw. Patient will see her at the [facility] walk-in clinic.

## 2014-05-15 ENCOUNTER — Ambulatory Visit (INDEPENDENT_AMBULATORY_CARE_PROVIDER_SITE_OTHER): Payer: BC Managed Care – PPO | Admitting: Family Medicine

## 2014-05-15 VITALS — BP 118/78 | HR 70 | Temp 98.3°F | Resp 16 | Ht 74.5 in | Wt 195.4 lb

## 2014-05-15 DIAGNOSIS — E785 Hyperlipidemia, unspecified: Secondary | ICD-10-CM

## 2014-05-15 DIAGNOSIS — E119 Type 2 diabetes mellitus without complications: Secondary | ICD-10-CM

## 2014-05-15 MED ORDER — PRAVASTATIN SODIUM 40 MG PO TABS: 40.0000 mg | ORAL_TABLET | Freq: Every day | ORAL | Status: DC

## 2014-05-15 NOTE — Patient Instructions (Signed)
Good to see you today- I will be in touch with your labs.  Continue to exercise and to maintain a healthy weight.  If you can check your records at home to make sure you have had the pneumonia shot that would be great! As a diabetic, there are several things you can do to monitor your condition and maintain your health.  1. Check your feet daily for any skin breakdown 2. Exercise and keep track of your diet 3. Let us know before you run out of your medications 4. Get your annual flu shot, and ask if you need a pneumonia shot 5. Ask if you are up to date on your labs; you should have an A1c every 6 months, a urine protein test annually, and a cholesterol test annually.  Your doctor may decide to do labs more often if indicated 6. Take off your shoes and socks at each visit.  Be sure your doctor examines your feet.   7. Ask about your blood pressure.  Your goal is 130/ 80 or less 8. Get an annual eye exam.  Please ask your ophthalmologist to send us your report 9. Keep up with your dental cleanings and exams.

## 2014-05-15 NOTE — Progress Notes (Signed)
[facility] [address] [place] [address] [phone]  Date:  05/15/2014   Name:  Adrian Mcdaniel   DOB:  07-20-1981   MRN:  [id number]  PCP:  No PCP Per Patient    Chief Complaint: Follow-up   History of Present Illness:  Adrian Mcdaniel is a 33 y.o. very pleasant male patient who presents with the following:  He is here today to follow-up on his DM and medications.  He ran out of his chl medication about one week ago.  He is taking metformin 500 twice a day and pravachol- no other medications Most recent labs in March- looked good except LDL a little bit high.   He thinks his last tetanus shot was 3 or 4 years ago He thinks he has had the pnuemovax.  He is quite active and gets a lot of exercise.  Otherwise he is generally healthy and feeling well   Patient Active Problem List   Diagnosis Date Noted  . DM (diabetes mellitus), type 2, uncontrolled 09/13/2013  . Hyperlipidemia LDL goal < 160 10/08/2012    Past Medical History  Diagnosis Date  . Diabetes mellitus without complication   . Hyperlipidemia     History reviewed. No pertinent past surgical history.  History  Substance Use Topics  . Smoking status: Former Smoker    Start date: 10/06/2006  . Smokeless tobacco: Not on file  . Alcohol Use: Yes    History reviewed. No pertinent family history.  No Known Allergies  Medication list has been reviewed and updated.  Current Outpatient Prescriptions on File Prior to Visit  Medication Sig Dispense Refill  . Blood Glucose Monitoring Suppl (BLOOD GLUCOSE METER) kit Use as instructed  1 each  0  . metFORMIN (GLUCOPHAGE) 500 MG tablet Take one twice daily, then moved upward to 2 twice daily as directed  360 tablet  3  . pravastatin (PRAVACHOL) 40 MG tablet Take 1 tablet (40 mg total) by mouth daily.  90 tablet  1  . glimepiride (AMARYL) 2 MG tablet Take 1 tablet (2 mg total) by mouth daily before breakfast.  180 tablet  3  .  promethazine (PHENERGAN) 25 MG tablet Take 1 tablet (25 mg total) by mouth every 6 (six) hours as needed for nausea.  30 tablet  0   No current facility-administered medications on file prior to visit.    Review of Systems:  As per HPI- otherwise negative.   Physical Examination: Filed Vitals:   05/15/14 1144  BP: 118/78  Pulse: 70  Temp: 98.3 F (36.8 C)  Resp: 16   Filed Vitals:   05/15/14 1144  Height: 6' 2.5" (1.892 m)  Weight: 195 lb 6.4 oz (88.633 kg)   Body mass index is 24.76 kg/(m^2). Ideal Body Weight: Weight in (lb) to have BMI = 25: 196.9  GEN: WDWN, NAD, Non-toxic, A & O x 3 HEENT: Atraumatic, Normocephalic. Neck supple. No masses, No LAD.  Bilateral TM wnl, oropharynx normal.  PEERL,EOMI.   Ears and Nose: No external deformity. CV: RRR, No M/G/R. No JVD. No thrill. No extra heart sounds. PULM: CTA B, no wheezes, crackles, rhonchi. No retractions. No resp. distress. No accessory muscle use. EXTR: No c/c/e NEURO Normal gait.  PSYCH: Normally interactive. Conversant. Not depressed or anxious appearing.  Calm demeanor.  Foot exam normal today  Assessment and Plan: Type II or unspecified type diabetes mellitus without mention of complication, not stated as uncontrolled - Plan: Basic metabolic  panel, Hemoglobin A1c  Dyslipidemia (high LDL; low HDL) - Plan: pravastatin (PRAVACHOL) 40 MG tablet, Basic metabolic panel   Encouraged eye exam.  He thinks he is UTD on his immunizations but asked him to try and find records if he can See patient instructions for more details.   Refilled medications   Signed Lamar Blinks, MD

## 2014-05-16 ENCOUNTER — Encounter: Payer: Self-pay | Admitting: Family Medicine

## 2014-05-16 LAB — BASIC METABOLIC PANEL
BUN: 10 mg/dL (ref 6–23)
CO2: 27 mEq/L (ref 19–32)
Calcium: 10.1 mg/dL (ref 8.4–10.5)
Chloride: 101 mEq/L (ref 96–112)
Creat: 0.88 mg/dL (ref 0.50–1.35)
Glucose, Bld: 88 mg/dL (ref 70–99)
Potassium: 4.2 mEq/L (ref 3.5–5.3)
Sodium: 139 mEq/L (ref 135–145)

## 2014-05-16 LAB — HEMOGLOBIN A1C
Hgb A1c MFr Bld: 6.1 % — ABNORMAL HIGH (ref <5.7)
Mean Plasma Glucose: 128 mg/dL — ABNORMAL HIGH (ref <117)

## 2014-09-07 ENCOUNTER — Ambulatory Visit (INDEPENDENT_AMBULATORY_CARE_PROVIDER_SITE_OTHER): Payer: BC Managed Care – PPO | Admitting: Emergency Medicine

## 2014-09-07 ENCOUNTER — Ambulatory Visit (INDEPENDENT_AMBULATORY_CARE_PROVIDER_SITE_OTHER): Payer: BC Managed Care – PPO

## 2014-09-07 VITALS — BP 130/90 | HR 95 | Temp 98.7°F | Resp 18 | Ht 74.0 in | Wt 202.9 lb

## 2014-09-07 DIAGNOSIS — M25572 Pain in left ankle and joints of left foot: Secondary | ICD-10-CM

## 2014-09-07 DIAGNOSIS — S93402A Sprain of unspecified ligament of left ankle, initial encounter: Secondary | ICD-10-CM

## 2014-09-07 MED ORDER — MELOXICAM 15 MG PO TABS: 15.0000 mg | ORAL_TABLET | Freq: Every day | ORAL | Status: DC

## 2014-09-07 NOTE — Progress Notes (Signed)
adio  Subjective:    Patient ID: Adrian Mcdaniel, male    DOB: 11-29-1980, 33 y.o.   MRN: [id number]  HPI  This is a 33 year old male with PMH DM2 and HLD who is presenting with a left ankle injury. He reports 1 day ago while going down stairs he tripped and inverted his ankle. He reports his ankle immediately started swelling and became painful to bear weight. He is experiencing swelling over his lateral ankle. He is experiencing pain over his medial and lateral ankle. He has not taken anything for the pain. He had an ace bandage at home and wrapped his ankle this morning which provided some relief. He denies paresthesias.   Review of Systems  Constitutional: Negative for fever and chills.  Musculoskeletal: Positive for arthralgias and gait problem.  Skin: Negative for color change and rash.   Patient Active Problem List   Diagnosis Date Noted  . DM (diabetes mellitus), type 2, uncontrolled 09/13/2013  . Hyperlipidemia LDL goal < 160 10/08/2012   Prior to Admission medications   Medication Sig Start Date End Date Taking? Authorizing Provider  Blood Glucose Monitoring Suppl (BLOOD GLUCOSE METER) kit Use as instructed 09/08/13  Yes Posey Boyer, MD  metFORMIN (GLUCOPHAGE) 500 MG tablet Take one twice daily, then moved upward to 2 twice daily as directed 09/08/13  Yes Posey Boyer, MD  pravastatin (PRAVACHOL) 40 MG tablet Take 1 tablet (40 mg total) by mouth daily. 05/15/14  Yes Darreld Mclean, MD   No Known Allergies  Patient's social and family history were reviewed.     Objective:   Physical Exam  Constitutional: He is oriented to person, place, and time. He appears well-developed and well-nourished. No distress.  HENT:  Head: Normocephalic and atraumatic.  Right Ear: Hearing normal.  Left Ear: Hearing normal.  Nose: Nose normal.  Eyes: Conjunctivae and lids are normal. Right eye exhibits no discharge. Left eye exhibits no discharge. No scleral icterus.  Cardiovascular:  Normal rate, regular rhythm, intact distal pulses and normal pulses.   Pulmonary/Chest: Effort normal. No respiratory distress.  Musculoskeletal: Normal range of motion.       Left ankle: He exhibits swelling (over lateral malleolus). He exhibits normal range of motion, no ecchymosis and normal pulse. Tenderness (lateral malleolus, lateral and medial ankle soft tissue). Achilles tendon normal.  Pain with resisted inversion and eversion  Neurological: He is alert and oriented to person, place, and time. He has normal strength. No sensory deficit.  Skin: Skin is warm, dry and intact. No lesion and no rash noted.  Psychiatric: He has a normal mood and affect. His speech is normal and behavior is normal. Thought content normal.   [facility] reading (PRIMARY) by  Dr. Ouida Sills: negative     Assessment & Plan:  1. Ankle pain, left 2. Sprain of ankle, left, initial encounter Radiograph negative for bony abnormality. He was fit for crutches and a cam walker. He will use crutches for next 48 hours, then graduate to a boot for 3-5 days and then to a supportive shoe for 1 week. He will return in 1 week if still unable to bear weight. Gave mobic and counseled on RICE.  - DG Ankle Complete Left; Future - meloxicam (MOBIC) 15 MG tablet; Take 1 tablet (15 mg total) by mouth daily.  Dispense: 30 tablet; Refill: 0   Benjaman Pott. Drenda Freeze, MHS [facility]  09/07/2014

## 2014-09-07 NOTE — Patient Instructions (Addendum)
Use crutches for 48 hours. Then wear cam walker for 3-5 days. If you are still unable to bear weight in 5-7 days, come back in for repeat xrays. If able to bear weight, graduate to a supportive shoe. No bare feet or flip flops for 2 weeks. Take mobic once a day to reduce inflammation.

## 2014-09-13 ENCOUNTER — Telehealth: Payer: Self-pay

## 2014-09-13 NOTE — Telephone Encounter (Signed)
[facility] for Dr. Clelia CroftShaw to Complete in 5 - 7 business days. Will place paperwork in Dr. Alver FisherShaw's Box, Please return to the Disability box at 102 checkout upon completion. Jasmine or myself will Scan this into epic and fax back to [facility]. Payment has not yet been received, and waiting on request form completion by patient, I have notified [place].

## 2014-09-13 NOTE — Telephone Encounter (Signed)
I will not complete his forms w/o an OV w/ me as I have not seen pt in almost 2 yrs - was seen by PA Bush/Anderson on 12/10.

## 2014-09-14 NOTE — Telephone Encounter (Signed)
° ° °  Patient called for status of his paperwork for FMLA.  Please call patient with status.  [phone]

## 2014-09-14 NOTE — Telephone Encounter (Signed)
Pt called back, and states that he saw [name], and was put out from 12/10 - 12/15 for his leg. This is what he is needing the FMLA ppw done for. I will reroute this to Dr. Dareen PianoAnderson/ P.A Bush

## 2014-09-14 NOTE — Telephone Encounter (Signed)
Called and spoke with Pt about needing to have an OV with Dr. Clelia CroftShaw for this FMLA ppw. Pt hung up phone.

## 2014-09-20 NOTE — Telephone Encounter (Signed)
Patient called to check the status of his FMLA. Patient states it has to be turned in by 09/27/2014. Paperwork received on 09/13/2014; today marks the 5th business day for completion. Please advise.

## 2014-09-20 NOTE — Telephone Encounter (Signed)
Completed paperwork put in [facility] file back box [facility]

## 2014-10-05 ENCOUNTER — Telehealth: Payer: Self-pay | Admitting: Physician Assistant

## 2014-10-05 NOTE — Telephone Encounter (Signed)
Patient brought in FMLA forms on 10/05/2013. States that his employer faxed paperwork over in [place] but they never received it. [facility] finished.

## 2014-11-10 ENCOUNTER — Ambulatory Visit (INDEPENDENT_AMBULATORY_CARE_PROVIDER_SITE_OTHER): Payer: Self-pay | Admitting: Family Medicine

## 2014-11-10 VITALS — BP 128/80 | HR 79 | Temp 98.1°F | Resp 18 | Ht 73.0 in | Wt 187.0 lb

## 2014-11-10 DIAGNOSIS — Z029 Encounter for administrative examinations, unspecified: Secondary | ICD-10-CM

## 2014-11-10 DIAGNOSIS — Z024 Encounter for examination for driving license: Secondary | ICD-10-CM

## 2014-11-10 NOTE — Patient Instructions (Signed)
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

## 2014-11-10 NOTE — Progress Notes (Signed)
DOT physical examination:  History: 34 year old man who is here for his annual DOT physical examination. He has diabetes mellitus, and reportedly is good control. His sugars usually run in the 90s. He has no major complaints.  Past medical history: Medical illnesses: Diabetes and hyperlipidemia Surgeries: None Regular medications: See list Allergies: None known  Social history: He is from [place]. He drives around the nation. He is single. He does get some exercise.  Review of systems: 11 system review was negative  Physical examination: Healthy-appearing gentleman in no acute distress. Alert and oriented. Weight is good. His TMs are normal. Eyes PERRLA. EOMs intact. Throat clear. Neck supple without nodes or thyromegaly. No carotid bruits. Chest is clear to auscultation. Heart regular without murmurs gallops or arrhythmias. Abdomen soft without mass or tenderness. Normal male external genitalia with testes descended. No hernias. Extremities unremarkable. Pedal pulses good.  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

## 2015-05-03 ENCOUNTER — Ambulatory Visit (INDEPENDENT_AMBULATORY_CARE_PROVIDER_SITE_OTHER): Payer: BLUE CROSS/BLUE SHIELD | Admitting: Emergency Medicine

## 2015-05-03 VITALS — BP 114/76 | HR 74 | Temp 98.2°F | Resp 14 | Ht 74.0 in | Wt 214.0 lb

## 2015-05-03 DIAGNOSIS — T161XXA Foreign body in right ear, initial encounter: Secondary | ICD-10-CM

## 2015-05-03 DIAGNOSIS — E1165 Type 2 diabetes mellitus with hyperglycemia: Secondary | ICD-10-CM | POA: Diagnosis not present

## 2015-05-03 DIAGNOSIS — IMO0002 Reserved for concepts with insufficient information to code with codable children: Secondary | ICD-10-CM

## 2015-05-03 NOTE — Progress Notes (Signed)
Subjective:  Patient ID: Adrian Mcdaniel, male    DOB: 1981-07-29  Age: 34 y.o. MRN: [id number]  CC: Foreign Body in [place] Adrian Mcdaniel presents  said that this morning he was out jogging and thought something enter his right ear. He said this felt funny all day. He has no decrease in his hearing or other acute complaints.  History Adrian Mcdaniel has a past medical history of Diabetes mellitus without complication and Hyperlipidemia.   He has no past surgical history on file.   His  family history is not on file.  He   reports that he has quit smoking. He started smoking about 8 years ago. He does not have any smokeless tobacco history on file. He reports that he drinks alcohol. He reports that he does not use illicit drugs.  Outpatient Prescriptions Prior to Visit  Medication Sig Dispense Refill  . Blood Glucose Monitoring Suppl (BLOOD GLUCOSE METER) kit Use as instructed 1 each 0  . metFORMIN (GLUCOPHAGE) 500 MG tablet Take one twice daily, then moved upward to 2 twice daily as directed 360 tablet 3  . meloxicam (MOBIC) 15 MG tablet Take 1 tablet (15 mg total) by mouth daily. (Patient not taking: Reported on 11/10/2014) 30 tablet 0  . pravastatin (PRAVACHOL) 40 MG tablet Take 1 tablet (40 mg total) by mouth daily. (Patient not taking: Reported on 05/03/2015) 90 tablet 3   No facility-administered medications prior to visit.    History   Social History  . Marital Status: Single    Spouse Name: N/A  . Number of Children: N/A  . Years of Education: N/A   Social History Main Topics  . Smoking status: Former Smoker    Start date: 10/06/2006  . Smokeless tobacco: Not on file  . Alcohol Use: Yes  . Drug Use: No  . Sexual Activity: Yes   Other Topics Concern  . None   Social History Narrative     Review of Systems  Constitutional: Negative for fever, chills and appetite change.  HENT: Negative for congestion, ear pain, postnasal drip, sinus pressure and sore  throat.   Eyes: Negative for pain and redness.  Respiratory: Negative for cough, shortness of breath and wheezing.   Cardiovascular: Negative for leg swelling.  Gastrointestinal: Negative for nausea, vomiting, abdominal pain, diarrhea, constipation and blood in stool.  Endocrine: Negative for polyuria.  Genitourinary: Negative for dysuria, urgency, frequency and flank pain.  Musculoskeletal: Negative for gait problem.  Skin: Negative for rash.  Neurological: Negative for weakness and headaches.  Psychiatric/Behavioral: Negative for confusion and decreased concentration. The patient is not nervous/anxious.     Objective:  BP 114/76 mmHg  Pulse 74  Temp(Src) 98.2 F (36.8 C) (Oral)  Resp 14  Ht '6\' 2"'  (1.88 m)  Wt 214 lb (97.07 kg)  BMI 27.46 kg/m2  SpO2 98%  Physical Exam  Constitutional: He is oriented to person, place, and time. He appears well-developed and well-nourished.  HENT:  Head: Normocephalic and atraumatic.  Eyes: Conjunctivae are normal. Pupils are equal, round, and reactive to light.  Pulmonary/Chest: Effort normal.  Musculoskeletal: He exhibits no edema.  Neurological: He is alert and oriented to person, place, and time.  Skin: Skin is dry.  Psychiatric: He has a normal mood and affect. His behavior is normal. Thought content normal.    There is no foreign body is  Assessment & Plan:   Adrian was seen today for foreign body in ear.  Diagnoses and all  orders for this visit:  FB ear, right, initial encounter  DM (diabetes mellitus), type 2, uncontrolled   I have discontinued Mr. Mcdaniel's pravastatin and meloxicam. I am also having him maintain his metFORMIN and blood glucose meter kit and supplies.  No orders of the defined types were placed in this encounter.   Said that this morning Appropriate red flag conditions were discussed with the patient as well as actions that should be taken.  Patient expressed his understanding.  Follow-up: Return if  symptoms worsen or fail to improve.  Roselee Culver, MD

## 2015-05-03 NOTE — Patient Instructions (Signed)

## 2015-09-21 ENCOUNTER — Ambulatory Visit (INDEPENDENT_AMBULATORY_CARE_PROVIDER_SITE_OTHER): Payer: BLUE CROSS/BLUE SHIELD | Admitting: Family Medicine

## 2015-09-21 VITALS — BP 132/72 | HR 75 | Temp 98.5°F | Resp 16 | Ht 74.0 in | Wt 212.0 lb

## 2015-09-21 DIAGNOSIS — Z113 Encounter for screening for infections with a predominantly sexual mode of transmission: Secondary | ICD-10-CM

## 2015-09-21 DIAGNOSIS — Z0000 Encounter for general adult medical examination without abnormal findings: Secondary | ICD-10-CM

## 2015-09-21 DIAGNOSIS — Z1329 Encounter for screening for other suspected endocrine disorder: Secondary | ICD-10-CM

## 2015-09-21 DIAGNOSIS — B353 Tinea pedis: Secondary | ICD-10-CM | POA: Diagnosis not present

## 2015-09-21 DIAGNOSIS — Z130 Encounter for screening for diseases of the blood and blood-forming organs and certain disorders involving the immune mechanism: Secondary | ICD-10-CM | POA: Diagnosis not present

## 2015-09-21 DIAGNOSIS — E785 Hyperlipidemia, unspecified: Secondary | ICD-10-CM | POA: Diagnosis not present

## 2015-09-21 DIAGNOSIS — Z1383 Encounter for screening for respiratory disorder NEC: Secondary | ICD-10-CM | POA: Diagnosis not present

## 2015-09-21 DIAGNOSIS — Z1389 Encounter for screening for other disorder: Secondary | ICD-10-CM | POA: Diagnosis not present

## 2015-09-21 DIAGNOSIS — Z136 Encounter for screening for cardiovascular disorders: Secondary | ICD-10-CM

## 2015-09-21 DIAGNOSIS — E119 Type 2 diabetes mellitus without complications: Secondary | ICD-10-CM | POA: Diagnosis not present

## 2015-09-21 LAB — POCT URINALYSIS DIP (MANUAL ENTRY)
Bilirubin, UA: NEGATIVE
Glucose, UA: NEGATIVE
Ketones, POC UA: NEGATIVE
Leukocytes, UA: NEGATIVE
Nitrite, UA: NEGATIVE
Protein Ur, POC: NEGATIVE
Spec Grav, UA: <=1.005
Urobilinogen, UA: 0.2
pH, UA: 6

## 2015-09-21 MED ORDER — CLOTRIMAZOLE 1 % EX CREA: 1.0000 "application " | TOPICAL_CREAM | Freq: Two times a day (BID) | CUTANEOUS | Status: DC

## 2015-09-21 MED ORDER — METFORMIN HCL 1000 MG PO TABS: 1000.0000 mg | ORAL_TABLET | Freq: Two times a day (BID) | ORAL | Status: DC

## 2015-09-21 NOTE — Patient Instructions (Signed)
Athlete's Foot Athlete's foot (tinea pedis) is a fungal infection of the skin on the feet. It often occurs on the skin between the toes or underneath the toes. It can also occur on the soles of the feet. Athlete's foot is more likely to occur in hot, humid weather. Not washing your feet or changing your socks often enough can contribute to athlete's foot. The infection can spread from person to person (contagious). CAUSES Athlete's foot is caused by a fungus. This fungus thrives in warm, moist places. Most people get athlete's foot by sharing shower stalls, towels, and wet floors with an infected person. People with weakened immune systems, including those with diabetes, may be more likely to get athlete's foot. SYMPTOMS   Itchy areas between the toes or on the soles of the feet.  White, flaky, or scaly areas between the toes or on the soles of the feet.  Tiny, intensely itchy blisters between the toes or on the soles of the feet.  Tiny cuts on the skin. These cuts can develop a bacterial infection.  Thick or discolored toenails. DIAGNOSIS  Your caregiver can usually tell what the problem is by doing a physical exam. Your caregiver may also take a skin sample from the rash area. The skin sample may be examined under a microscope, or it may be tested to see if fungus will grow in the sample. A sample may also be taken from your toenail for testing. TREATMENT  Over-the-counter and prescription medicines can be used to kill the fungus. These medicines are available as powders or creams. Your caregiver can suggest medicines for you. Fungal infections respond slowly to treatment. You may need to continue using your medicine for several weeks. PREVENTION   Do not share towels.  Wear sandals in wet areas, such as shared locker rooms and shared showers.  Keep your feet dry. Wear shoes that allow air to circulate. Wear cotton or wool socks. HOME CARE INSTRUCTIONS   Take medicines as directed by  your caregiver. Do not use steroid creams on athlete's foot.  Keep your feet clean and cool. Wash your feet daily and dry them thoroughly, especially between your toes.  Change your socks every day. Wear cotton or wool socks. In hot climates, you may need to change your socks 2 to 3 times per day.  Wear sandals or canvas tennis shoes with good air circulation.  If you have blisters, soak your feet in Burow's solution or Epsom salts for 20 to 30 minutes, 2 times a day to dry out the blisters. Make sure you dry your feet thoroughly afterward. SEEK MEDICAL CARE IF:   You have a fever.  You have swelling, soreness, warmth, or redness in your foot.  You are not getting better after 7 days of treatment.  You are not completely cured after 30 days.  You have any problems caused by your medicines. MAKE SURE YOU:   Understand these instructions.  Will watch your condition.  Will get help right away if you are not doing well or get worse.   This information is not intended to replace advice given to you by your health care provider. Make sure you discuss any questions you have with your health care provider.   Document Released: 09/12/2000 Document Revised: 12/08/2011 Document Reviewed: 03/19/2015 Elsevier Interactive Patient Education 2016 [employer].  Health Maintenance, Male A healthy lifestyle and preventative care can promote health and wellness.  Maintain regular health, dental, and eye exams.  Eat a healthy  diet. Foods like vegetables, fruits, whole grains, low-fat dairy products, and lean protein foods contain the nutrients you need and are low in calories. Decrease your intake of foods high in solid fats, added sugars, and salt. Get information about a proper diet from your health care provider, if necessary.  Regular physical exercise is one of the most important things you can do for your health. Most adults should get at least 150 minutes of moderate-intensity exercise  (any activity that increases your heart rate and causes you to sweat) each week. In addition, most adults need muscle-strengthening exercises on 2 or more days a week.   Maintain a healthy weight. The body mass index (BMI) is a screening tool to identify possible weight problems. It provides an estimate of body fat based on height and weight. Your health care provider can find your BMI and can help you achieve or maintain a healthy weight. For males 20 years and older:  A BMI below 18.5 is considered underweight.  A BMI of 18.5 to 24.9 is normal.  A BMI of 25 to 29.9 is considered overweight.  A BMI of 30 and above is considered obese.  Maintain normal blood lipids and cholesterol by exercising and minimizing your intake of saturated fat. Eat a balanced diet with plenty of fruits and vegetables. Blood tests for lipids and cholesterol should begin at age 26 and be repeated every 5 years. If your lipid or cholesterol levels are high, you are over age 76, or you are at high risk for heart disease, you may need your cholesterol levels checked more frequently.Ongoing high lipid and cholesterol levels should be treated with medicines if diet and exercise are not working.  If you smoke, find out from your health care provider how to quit. If you do not use tobacco, do not start.  Lung cancer screening is recommended for adults aged 55-80 years who are at high risk for developing lung cancer because of a history of smoking. A yearly low-dose CT scan of the lungs is recommended for people who have at least a 30-pack-year history of smoking and are current smokers or have quit within the past 15 years. A pack year of smoking is smoking an average of 1 pack of cigarettes a day for 1 year (for example, a 30-pack-year history of smoking could mean smoking 1 pack a day for 30 years or 2 packs a day for 15 years). Yearly screening should continue until the smoker has stopped smoking for at least 15 years. Yearly  screening should be stopped for people who develop a health problem that would prevent them from having lung cancer treatment.  If you choose to drink alcohol, do not have more than 2 drinks per day. One drink is considered to be 12 oz (360 mL) of beer, 5 oz (150 mL) of wine, or 1.5 oz (45 mL) of liquor.  Avoid the use of street drugs. Do not share needles with anyone. Ask for help if you need support or instructions about stopping the use of drugs.  High blood pressure causes heart disease and increases the risk of stroke. High blood pressure is more likely to develop in:  People who have blood pressure in the end of the normal range (100-139/85-89 mm Hg).  People who are overweight or obese.  People who are African American.  If you are 70-43 years of age, have your blood pressure checked every 3-5 years. If you are 26 years of age or older, have  your blood pressure checked every year. You should have your blood pressure measured twice--once when you are at a hospital or clinic, and once when you are not at a hospital or clinic. Record the average of the two measurements. To check your blood pressure when you are not at a hospital or clinic, you can use:  An automated blood pressure machine at a pharmacy.  A home blood pressure monitor.  If you are 6945-36 years old, ask your health care provider if you should take aspirin to prevent heart disease.  Diabetes screening involves taking a blood sample to check your fasting blood sugar level. This should be done once every 3 years after age 36 if you are at a normal weight and without risk factors for diabetes. Testing should be considered at a younger age or be carried out more frequently if you are overweight and have at least 1 risk factor for diabetes.  Colorectal cancer can be detected and often prevented. Most routine colorectal cancer screening begins at the age of 36 and continues through age 36. However, your health care provider may  recommend screening at an earlier age if you have risk factors for colon cancer. On a yearly basis, your health care provider may provide home test kits to check for hidden blood in the stool. A small camera at the end of a tube may be used to directly examine the colon (sigmoidoscopy or colonoscopy) to detect the earliest forms of colorectal cancer. Talk to your health care provider about this at age 36 when routine screening begins. A direct exam of the colon should be repeated every 5-10 years through age 36, unless early forms of precancerous polyps or small growths are found.  People who are at an increased risk for hepatitis B should be screened for this virus. You are considered at high risk for hepatitis B if:  You were born in a country where hepatitis B occurs often. Talk with your health care provider about which countries are considered high risk.  Your parents were born in a high-risk country and you have not received a shot to protect against hepatitis B (hepatitis B vaccine).  You have HIV or AIDS.  You use needles to inject street drugs.  You live with, or have sex with, someone who has hepatitis B.  You are a man who has sex with other men (MSM).  You get hemodialysis treatment.  You take certain medicines for conditions like cancer, organ transplantation, and autoimmune conditions.  Hepatitis C blood testing is recommended for all people born from [id number] through 1967 and any individual with known risk factors for hepatitis C.  Healthy men should no longer receive prostate-specific antigen (PSA) blood tests as part of routine cancer screening. Talk to your health care provider about prostate cancer screening.  Testicular cancer screening is not recommended for adolescents or adult males who have no symptoms. Screening includes self-exam, a health care provider exam, and other screening tests. Consult with your health care provider about any symptoms you have or any concerns you  have about testicular cancer.  Practice safe sex. Use condoms and avoid high-risk sexual practices to reduce the spread of sexually transmitted infections (STIs).  You should be screened for STIs, including gonorrhea and chlamydia if:  You are sexually active and are younger than 24 years.  You are older than 24 years, and your health care provider tells you that you are at risk for this type of infection.  Your sexual  activity has changed since you were last screened, and you are at an increased risk for chlamydia or gonorrhea. Ask your health care provider if you are at risk.  If you are at risk of being infected with HIV, it is recommended that you take a prescription medicine daily to prevent HIV infection. This is called pre-exposure prophylaxis (PrEP). You are considered at risk if:  You are a man who has sex with other men (MSM).  You are a heterosexual man who is sexually active with multiple partners.  You take drugs by injection.  You are sexually active with a partner who has HIV.  Talk with your health care provider about whether you are at high risk of being infected with HIV. If you choose to begin PrEP, you should first be tested for HIV. You should then be tested every 3 months for as long as you are taking PrEP.  Use sunscreen. Apply sunscreen liberally and repeatedly throughout the day. You should seek shade when your shadow is shorter than you. Protect yourself by wearing long sleeves, pants, a wide-brimmed hat, and sunglasses year round whenever you are outdoors.  Tell your health care provider of new moles or changes in moles, especially if there is a change in shape or color. Also, tell your health care provider if a mole is larger than the size of a pencil eraser.  A one-time screening for abdominal aortic aneurysm (AAA) and surgical repair of large AAAs by ultrasound is recommended for men aged 65-75 years who are current or former smokers.  Stay current with  your vaccines (immunizations).   This information is not intended to replace advice given to you by your health care provider. Make sure you discuss any questions you have with your health care provider.   Document Released: 03/13/2008 Document Revised: 10/06/2014 Document Reviewed: 02/10/2011 Elsevier Interactive Patient Education [employer]

## 2015-09-21 NOTE — Progress Notes (Addendum)
Subjective:  By signing my name below, I, Adrian Mcdaniel, attest that this documentation has been prepared under the direction and in the presence of Adrian Cheadle, MD.  Leandra Kern, Medical Scribe. 09/21/2015.  6:18 PM.    Patient ID: Adrian Mcdaniel, male    DOB: 1981-01-01, 34 y.o.   MRN: [id number]  Chief Complaint  Patient presents with  . Annual Exam    HPI HPI Comments: Adrian Mcdaniel is a 34 y.o. male with a hx of DM, and HLD who presents to [facility] for a complete physical exam.  Pt was seen here for a full physical 3 years ago.  Pt regularly checks his blood sugar, about once per week, and notes that it usually ranges about 120. Pt reports that he ran out of the metformin that he used to take two dosages BID about a month ago. He denies experiencing any side effects with it. Pt does not regularly follow up with an opthalmologist. He notes that regularly performs physical exercises. He denies any urinary issues, chest pain, shortness of breath, palpitations, rashes. Pt is currently fasting.   Pt reports that he experiencing excessive scaling of the skin on his feet. He denies rashes to the area.    Pt states that he presents with papules at the base of his penis at times, and notes that they are itchy, however they do resolve by themselves.   He denies family history of cancer.   Pt takes Multivitamins daily.   Pt quit smoking few years ago.    Patient Active Problem List   Diagnosis Date Noted  . DM (diabetes mellitus), type 2, uncontrolled ([place]) 09/13/2013  . Hyperlipidemia LDL goal < 160 10/08/2012   Past Medical History  Diagnosis Date  . Diabetes mellitus without complication ([place])   . Hyperlipidemia    History reviewed. No pertinent past surgical history. No Known Allergies Prior to Admission medications   Medication Sig Start Date End Date Taking? Authorizing Provider  Blood Glucose Monitoring Suppl (BLOOD GLUCOSE METER) kit Use  as instructed Patient not taking: Reported on 09/21/2015 09/08/13   Posey Boyer, MD  metFORMIN (GLUCOPHAGE) 500 MG tablet Take one twice daily, then moved upward to 2 twice daily as directed Patient not taking: Reported on 09/21/2015 09/08/13   Posey Boyer, MD   Social History   Social History  . Marital Status: Single    Spouse Name: N/A  . Number of Children: N/A  . Years of Education: N/A   Occupational History  . Not on file.   Social History Main Topics  . Smoking status: Former Smoker    Start date: 10/06/2006  . Smokeless tobacco: Not on file  . Alcohol Use: Yes  . Drug Use: No  . Sexual Activity: Yes   Other Topics Concern  . Not on file   Social History Narrative    Review of Systems  Respiratory: Negative for shortness of breath.   Cardiovascular: Negative for chest pain and palpitations.  Gastrointestinal: Negative for diarrhea, constipation and blood in stool.  Genitourinary: Negative for dysuria, frequency, discharge and penile pain.  Skin: Negative for rash.  Hematological: Negative for adenopathy.  All other systems reviewed and are negative.      Objective:   Physical Exam  Constitutional: He is oriented to person, place, and time. He appears well-developed and well-nourished. No distress.  HENT:  Head: Normocephalic and atraumatic.  Nose: Nose normal.  Mid ear effusion.  2+ tonsils at baseline.  Normal thyroid.  No adenopathy.   Eyes: EOM are normal. Pupils are equal, round, and reactive to light.  Neck: Neck supple.  Cardiovascular: Normal rate, regular rhythm, S1 normal and S2 normal.   No murmur heard. Pulmonary/Chest: Effort normal and breath sounds normal. No respiratory distress. He has no wheezes.  Abdominal: Bowel sounds are normal.  Neurological: He is alert and oriented to person, place, and time. No cranial nerve deficit.  Reflex Scores:      Patellar reflexes are 2+ on the right side and 2+ on the left side.      Achilles  reflexes are 2+ on the right side and 2+ on the left side. Skin: Skin is warm and dry.  Psychiatric: He has a normal mood and affect. His behavior is normal.  Nursing note and vitals reviewed.   BP 132/72 mmHg  Pulse 75  Temp(Src) 98.5 F (36.9 C)  Resp 16  Ht '6\' 2"'  (1.88 m)  Wt 212 lb (96.163 kg)  BMI 27.21 kg/m2  SpO2 95%     Assessment & Plan:   1. Annual physical exam   2. Type 2 diabetes mellitus without complication, without long-term current use of insulin (HCC) - off metformin 1g bid x 1 mo, restart.  3. Hyperlipidemia with target LDL less than 100 - VERY POOR - start atorvastatin 80, rtc in 1-2 mos for repeat lfts, then rtc in 4-6 mos for repeat flp.  4. Tinea pedis of both feet - start top antifungal bid x 2 wks  5. Routine screening for STI (sexually transmitted infection)   6. Screening for cardiovascular, respiratory, and genitourinary diseases   7. Screening for thyroid disorder   8. Screening for deficiency anemia     Orders Placed This Encounter  Procedures  . GC/Chlamydia Probe Amp  . Trichomonas vaginalis, RNA  . Comprehensive metabolic panel    Order Specific Question:  Has the patient fasted?    Answer:  Yes  . CBC  . Lipid panel    Order Specific Question:  Has the patient fasted?    Answer:  Yes  . Hemoglobin A1c  . TSH  . Microalbumin/Creatinine Ratio, Urine  . HIV antibody  . RPR  . Hepatitis C Antibody  . Ambulatory referral to Ophthalmology    Referral Priority:  Routine    Referral Type:  Consultation    Referral Reason:  Specialty Services Required    Requested Specialty:  Ophthalmology    Number of Visits Requested:  1  . POCT urinalysis dipstick    Meds ordered this encounter  Medications  . metFORMIN (GLUCOPHAGE) 1000 MG tablet    Sig: Take 1 tablet (1,000 mg total) by mouth 2 (two) times daily with a meal.    Dispense:  180 tablet    Refill:  3  . clotrimazole (LOTRIMIN) 1 % cream    Sig: Apply 1 application topically 2  (two) times daily. For at least 2 weeks    Dispense:  113 g    Refill:  0    I personally performed the services described in this documentation, which was scribed in my presence. The recorded information has been reviewed and considered, and addended by me as needed.  Adrian Cheadle, MD MPH  Results for orders placed or performed in visit on 09/21/15  Comprehensive metabolic panel  Result Value Ref Range   Sodium 135 135 - 146 mmol/L   Potassium 3.8 3.5 - 5.3 mmol/L  Chloride 96 (L) 98 - 110 mmol/L   CO2 29 20 - 31 mmol/L   Glucose, Bld 90 65 - 99 mg/dL   BUN 13 7 - 25 mg/dL   Creat 1.07 0.60 - 1.35 mg/dL   Total Bilirubin 0.7 0.2 - 1.2 mg/dL   Alkaline Phosphatase 48 40 - 115 U/L   AST 25 10 - 40 U/L   ALT 32 9 - 46 U/L   Total Protein 8.2 (H) 6.1 - 8.1 g/dL   Albumin 5.0 3.6 - 5.1 g/dL   Calcium 10.0 8.6 - 10.3 mg/dL  CBC  Result Value Ref Range   WBC 6.2 4.0 - 10.5 K/uL   RBC 5.33 4.22 - 5.81 MIL/uL   Hemoglobin 14.8 13.0 - 17.0 g/dL   HCT 42.9 39.0 - 52.0 %   MCV 80.5 78.0 - 100.0 fL   MCH 27.8 26.0 - 34.0 pg   MCHC 34.5 30.0 - 36.0 g/dL   RDW 15.6 (H) 11.5 - 15.5 %   Platelets 235 150 - 400 K/uL   MPV 10.1 8.6 - 12.4 fL  Lipid panel  Result Value Ref Range   Cholesterol 326 (H) 125 - 200 mg/dL   Triglycerides 182 (H) <150 mg/dL   HDL 51 >=40 mg/dL   Total CHOL/HDL Ratio 6.4 (H) <=5.0 Ratio   VLDL 36 (H) <30 mg/dL   LDL Cholesterol 239 (H) <130 mg/dL  Hemoglobin A1c  Result Value Ref Range   Hgb A1c MFr Bld 6.4 (H) <5.7 %   Mean Plasma Glucose 137 (H) <117 mg/dL  TSH  Result Value Ref Range   TSH 2.715 0.350 - 4.500 uIU/mL  Microalbumin/Creatinine Ratio, Urine  Result Value Ref Range   Creatinine, Urine 60 20 - 370 mg/dL   Microalb, Ur 0.6 Not estab mg/dL   Microalb Creat Ratio 10 <30 mcg/mg creat  HIV antibody  Result Value Ref Range   HIV 1&2 Ab, 4th Generation NONREACTIVE NONREACTIVE  RPR  Result Value Ref Range   RPR Ser Ql NON REAC NON REAC    Hepatitis C Antibody  Result Value Ref Range   HCV Ab NEGATIVE NEGATIVE  POCT urinalysis dipstick  Result Value Ref Range   Color, UA yellow yellow   Clarity, UA clear clear   Glucose, UA negative negative   Bilirubin, UA negative negative   Ketones, POC UA negative negative   Spec Grav, UA <=1.005    Blood, UA small (A) negative   pH, UA 6.0    Protein Ur, POC negative negative   Urobilinogen, UA 0.2    Nitrite, UA Negative Negative   Leukocytes, UA Negative Negative

## 2015-09-22 LAB — CBC
HCT: 42.9 % (ref 39.0–52.0)
Hemoglobin: 14.8 g/dL (ref 13.0–17.0)
MCH: 27.8 pg (ref 26.0–34.0)
MCHC: 34.5 g/dL (ref 30.0–36.0)
MCV: 80.5 fL (ref 78.0–100.0)
MPV: 10.1 fL (ref 8.6–12.4)
Platelets: 235 10*3/uL (ref 150–400)
RBC: 5.33 MIL/uL (ref 4.22–5.81)
RDW: 15.6 % — ABNORMAL HIGH (ref 11.5–15.5)
WBC: 6.2 10*3/uL (ref 4.0–10.5)

## 2015-09-22 LAB — MICROALBUMIN / CREATININE URINE RATIO
Creatinine, Urine: 60 mg/dL (ref 20–370)
Microalb Creat Ratio: 10 mcg/mg creat (ref <30)
Microalb, Ur: 0.6 mg/dL

## 2015-09-22 LAB — LIPID PANEL
Cholesterol: 326 mg/dL — ABNORMAL HIGH (ref 125–200)
HDL: 51 mg/dL (ref >=40)
LDL Cholesterol: 239 mg/dL — ABNORMAL HIGH (ref <130)
Total CHOL/HDL Ratio: 6.4 Ratio — ABNORMAL HIGH (ref <=5.0)
Triglycerides: 182 mg/dL — ABNORMAL HIGH (ref <150)
VLDL: 36 mg/dL — ABNORMAL HIGH (ref <30)

## 2015-09-22 LAB — COMPREHENSIVE METABOLIC PANEL
ALT: 32 U/L (ref 9–46)
AST: 25 U/L (ref 10–40)
Albumin: 5 g/dL (ref 3.6–5.1)
Alkaline Phosphatase: 48 U/L (ref 40–115)
BUN: 13 mg/dL (ref 7–25)
CO2: 29 mmol/L (ref 20–31)
Calcium: 10 mg/dL (ref 8.6–10.3)
Chloride: 96 mmol/L — ABNORMAL LOW (ref 98–110)
Creat: 1.07 mg/dL (ref 0.60–1.35)
Glucose, Bld: 90 mg/dL (ref 65–99)
Potassium: 3.8 mmol/L (ref 3.5–5.3)
Sodium: 135 mmol/L (ref 135–146)
Total Bilirubin: 0.7 mg/dL (ref 0.2–1.2)
Total Protein: 8.2 g/dL — ABNORMAL HIGH (ref 6.1–8.1)

## 2015-09-22 LAB — HEMOGLOBIN A1C
Hgb A1c MFr Bld: 6.4 % — ABNORMAL HIGH (ref <5.7)
Mean Plasma Glucose: 137 mg/dL — ABNORMAL HIGH (ref <117)

## 2015-09-22 LAB — TSH: TSH: 2.715 u[IU]/mL (ref 0.350–4.500)

## 2015-09-22 LAB — HEPATITIS C ANTIBODY: HCV AB: NEGATIVE

## 2015-09-22 LAB — HIV ANTIBODY (ROUTINE TESTING W REFLEX): HIV 1&2 Ab, 4th Generation: NONREACTIVE

## 2015-09-23 LAB — RPR

## 2015-09-24 ENCOUNTER — Encounter: Payer: Self-pay | Admitting: Family Medicine

## 2015-09-24 MED ORDER — ATORVASTATIN CALCIUM 80 MG PO TABS: 80.0000 mg | ORAL_TABLET | Freq: Every day | ORAL | Status: DC

## 2015-09-25 LAB — GC/CHLAMYDIA PROBE AMP
CT Probe RNA: NOT DETECTED
GC PROBE AMP APTIMA: NOT DETECTED

## 2015-09-26 LAB — TRICHOMONAS VAGINALIS, PROBE AMP: TRICHOMONAS VAGINALIS PROBE APTIMA: NEGATIVE

## 2015-10-22 LAB — HM DIABETES EYE EXAM

## 2015-11-19 ENCOUNTER — Encounter: Payer: Self-pay | Admitting: Family Medicine

## 2015-12-13 IMAGING — CR DG ANKLE COMPLETE 3+V*L*
4 series · 4 of 4 positions shown · non-contrast
Comparison: None.

CLINICAL DATA: Ankle injury going down stairs.  Pain.

EXAM:
LEFT ANKLE COMPLETE - 3+ VIEW

[AP]
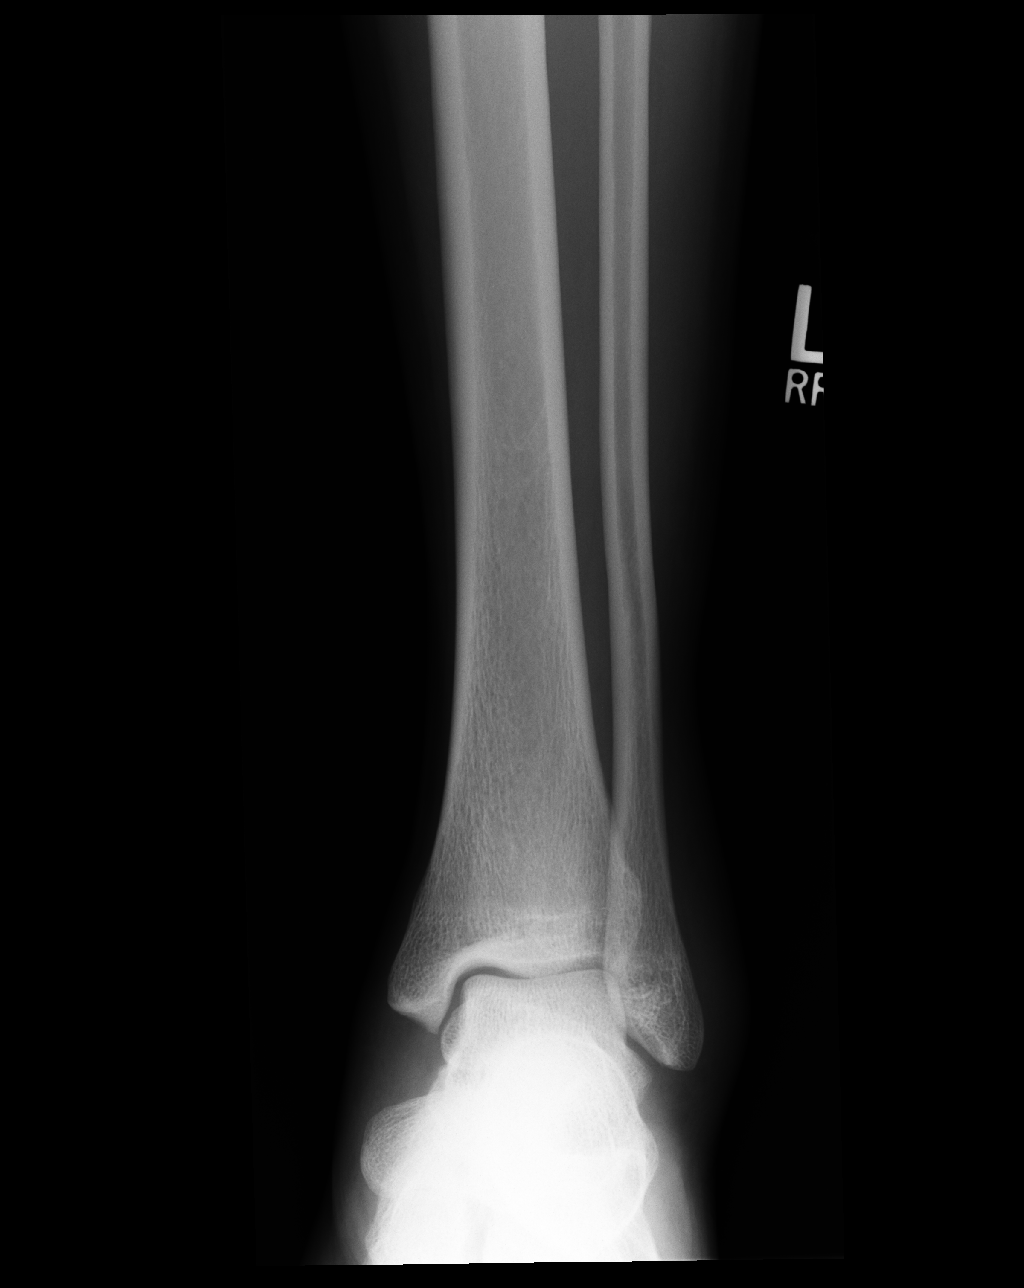

[ap obl int rot (1 of 2)]
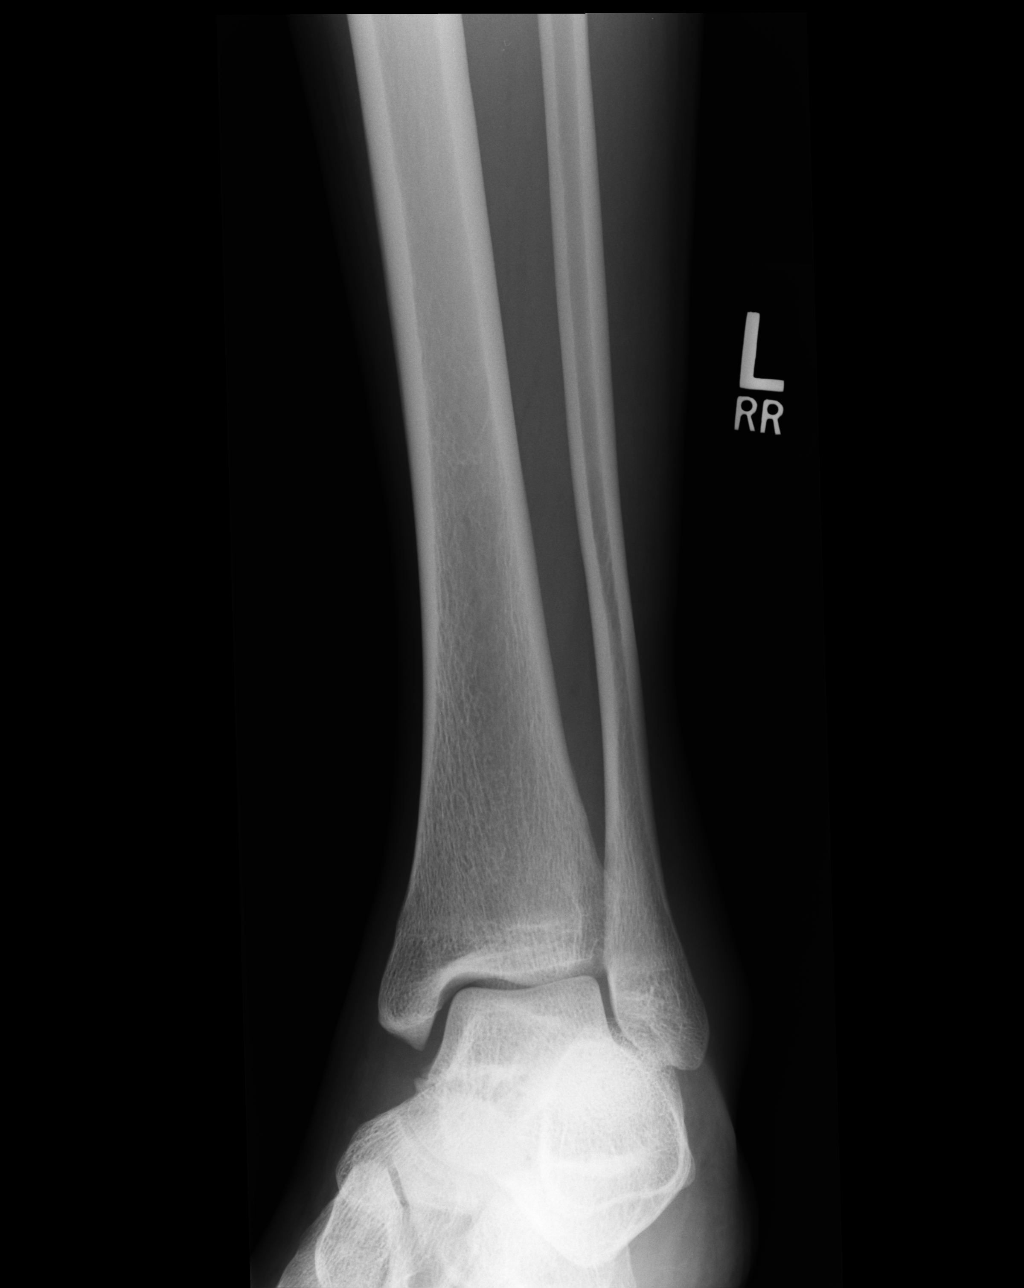

[ap obl int rot (2 of 2)]
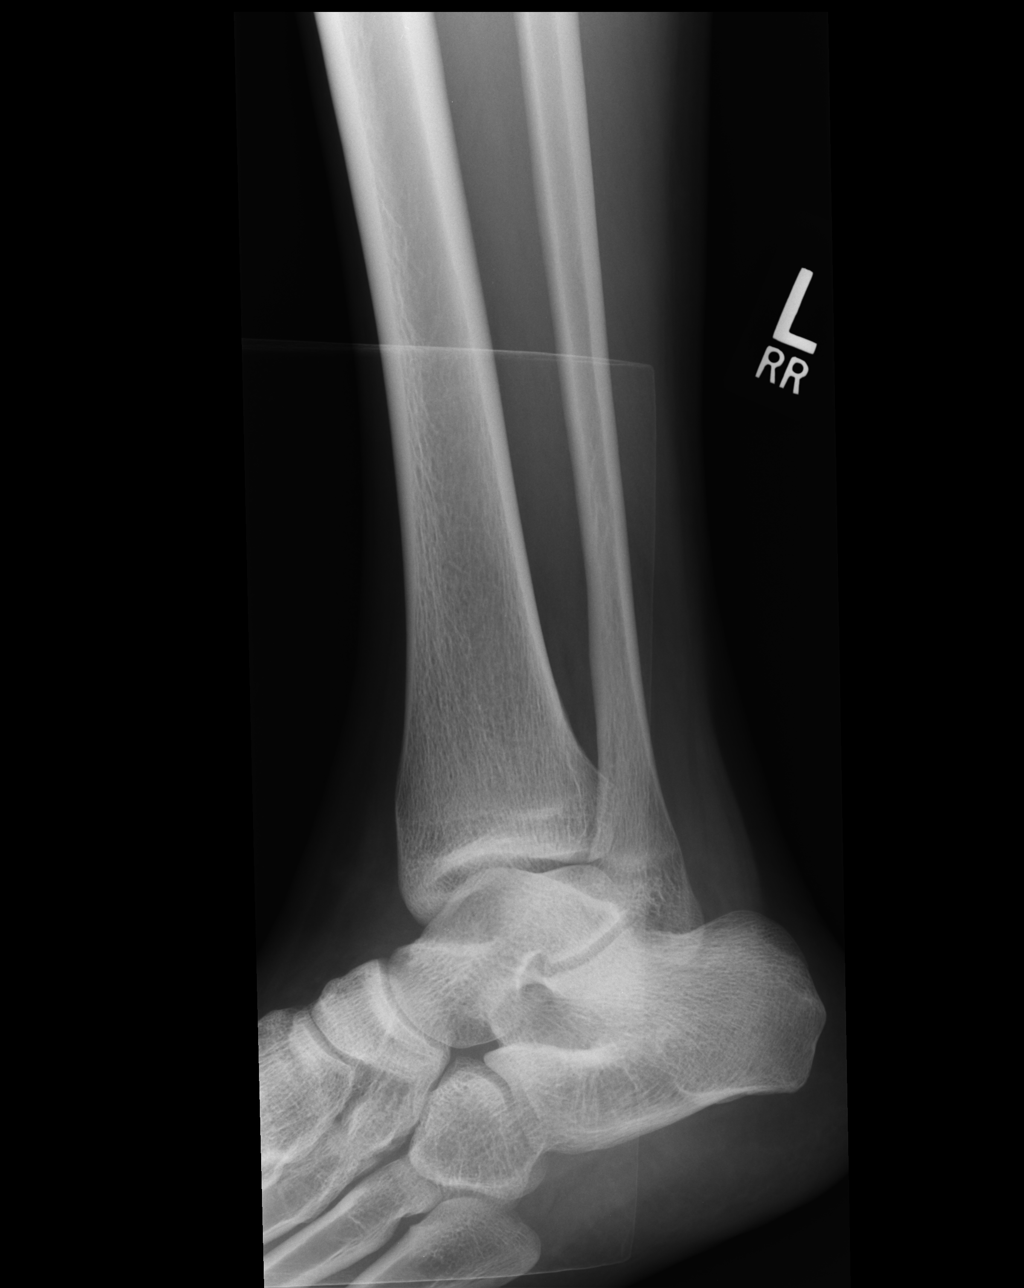

[lateral]
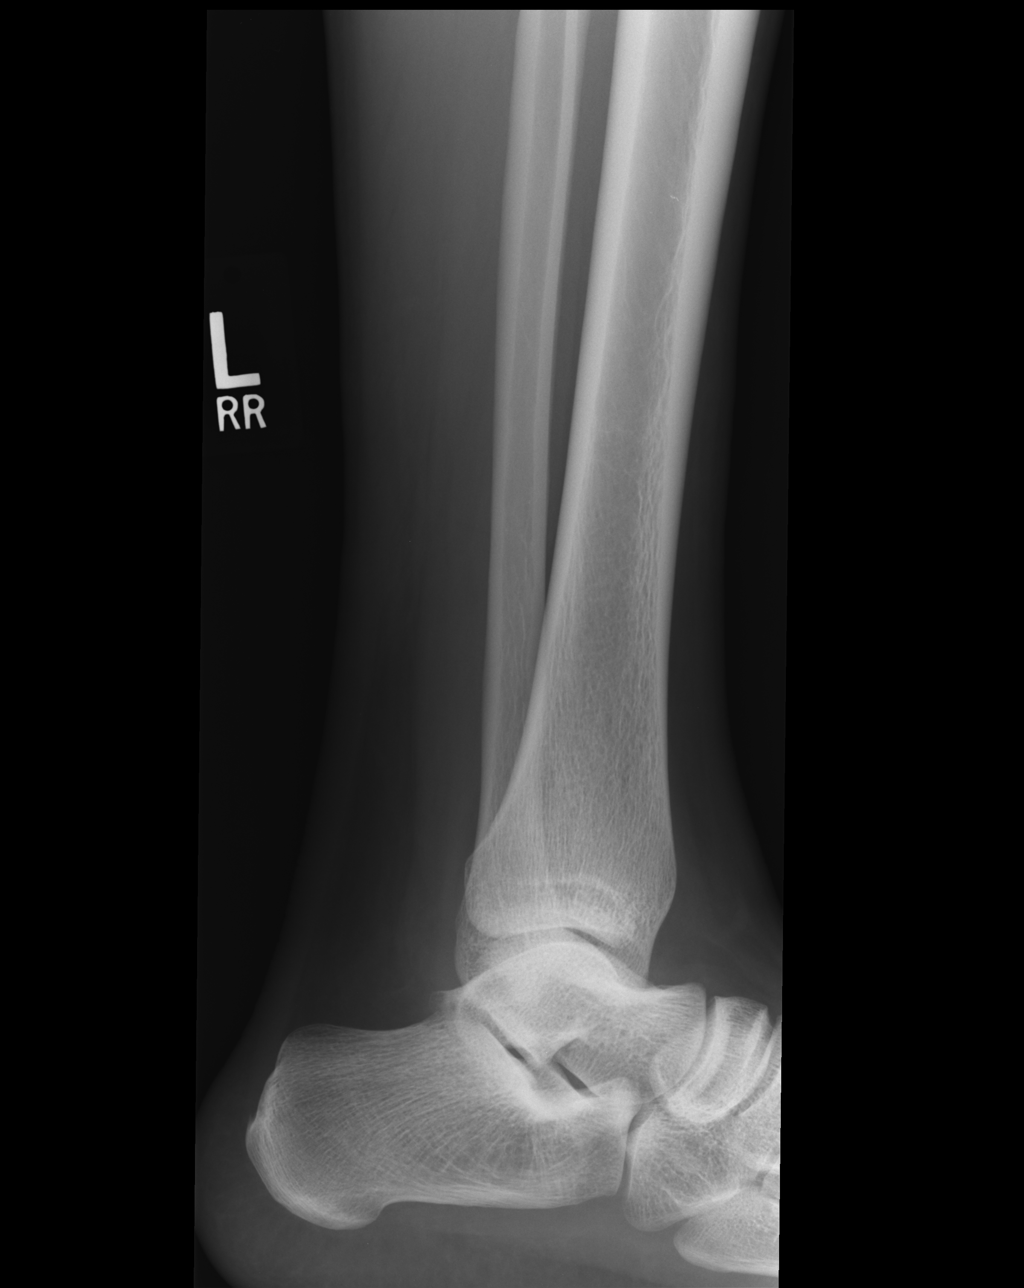

[4 of 4 positions shown; findings below may reference images not displayed]

FINDINGS: Soft tissue swelling is present over the lateral malleolus. There is
no underlying fracture. The ankle joint is located. There is no
significant effusion.
IMPRESSION: Soft tissue swelling over the lateral malleolus without an
underlying fracture or dislocation.

## 2016-01-08 ENCOUNTER — Ambulatory Visit (INDEPENDENT_AMBULATORY_CARE_PROVIDER_SITE_OTHER): Payer: Managed Care, Other (non HMO) | Admitting: Family Medicine

## 2016-01-08 ENCOUNTER — Other Ambulatory Visit: Payer: Managed Care, Other (non HMO)

## 2016-01-08 VITALS — BP 122/78 | HR 74 | Temp 98.2°F | Resp 16 | Ht 74.0 in | Wt 220.0 lb

## 2016-01-08 DIAGNOSIS — Z021 Encounter for pre-employment examination: Secondary | ICD-10-CM

## 2016-01-08 DIAGNOSIS — Z5181 Encounter for therapeutic drug level monitoring: Secondary | ICD-10-CM | POA: Diagnosis not present

## 2016-01-08 DIAGNOSIS — Z024 Encounter for examination for driving license: Secondary | ICD-10-CM

## 2016-01-08 NOTE — Patient Instructions (Signed)
     IF you received an x-ray today, you will receive an invoice from [facility]. Please contact [facility] at [phone] with questions or concerns regarding your invoice.   IF you received labwork today, you will receive an invoice from [facility]. Please contact [facility] at [phone] with questions or concerns regarding your invoice.   Our billing staff will not be able to assist you with questions regarding bills from these companies.  You will be contacted with the lab results as soon as they are available. The fastest way to get your results is to activate your My Chart account. Instructions are located on the last page of this paperwork. If you have not heard from us regarding the results in 2 weeks, please contact this office.      

## 2016-01-08 NOTE — Progress Notes (Signed)
Commercial Driver Medical Examination   Adrian Mcdaniel is a 35 y.o. male who presents today for a commercial driver fitness determination physical exam. The patient reports no problems.  Has type 2 DM that is very well controlled on metformin.  HPL - recently increased atorvastatin and is tolerating it well w/o no prob.  I am pt's PCP - saw him several months ago for a full CPE and he was in good health at that time as well. The following portions of the patient's history were reviewed and updated as appropriate: allergies, current medications, past family history, past medical history, past social history, past surgical history and problem list. Review of Systems A comprehensive review of systems was negative.   Objective:    Vision:  Visual Acuity Screening   Right eye Left eye Both eyes  Without correction:  With correction:     Comments: Peripheral Vision: Right eye 70 degrees. Left eye 70 degrees.  The patient can distinguish the colors red, amber and green.  Applicant can recognize and distinguish among traffic control signals and devices showing standard red, green, and amber colors.     Monocular Vision?: No   Hearing:  Hearing Screening Comments: The patient was able to hear a forced whisper from 10 feet.        BP 122/78 mmHg  Pulse 74  Temp(Src) 98.2 F (36.8 C)  Resp 16  Ht  (1.88 m)  Wt 220 lb (99.791 kg)  BMI 28.23 kg/m2  SpO2 99%  General Appearance:    Alert, cooperative, no distress, appears stated age  Head:    Normocephalic, without obvious abnormality, atraumatic  Eyes:    PERRL, conjunctiva/corneas clear, EOM's intact, fundi    benign, both eyes       Ears:    Normal TM's and external ear canals, both ears  Nose:   Nares normal, septum midline, mucosa normal, no drainage    or sinus tenderness  Throat:   Lips, mucosa, and tongue normal; teeth and gums normal  Neck:   Supple, symmetrical, trachea midline, no adenopathy;        thyroid:  No enlargement/tenderness/nodules; no carotid   bruit or JVD  Back:     Symmetric, no curvature, ROM normal, no CVA tenderness  Lungs:     Clear to auscultation bilaterally, respirations unlabored  Chest wall:    No tenderness or deformity  Heart:    Regular rate and rhythm, S1 and S2 normal, no murmur, rub   or gallop  Abdomen:     Soft, non-tender, bowel sounds active all four quadrants,    no masses, no organomegaly        Extremities:   Extremities normal, atraumatic, no cyanosis or edema  Pulses:   2+ and symmetric all extremities  Skin:   Skin color, texture, turgor normal, no rashes or lesions  Lymph nodes:   Cervical, supraclavicular, and axillary nodes normal  Neurologic:   CNII-XII intact. Normal strength, sensation and reflexes      throughout    Labs: Lab Results  Component Value Date   SPECGRAV <=1.005 09/21/2015   PROTEINUR neg 09/12/2013   BILIRUBINUR negative 09/21/2015  UA SG 1.005, neg prot, neg bld, neg sugar    Assessment:    Healthy male exam.  Meets standards, but periodic monitoring required due to T2DM, HPL.  Driver qualified only for 1 year.    Plan:    Medical examiners certificate completed and printed. Return  as needed.

## 2016-01-09 LAB — ALT: ALT: 31 U/L (ref 9–46)

## 2016-01-14 ENCOUNTER — Encounter: Payer: Self-pay | Admitting: Family Medicine

## 2016-02-11 ENCOUNTER — Encounter: Payer: Self-pay | Admitting: Family Medicine

## 2016-07-26 ENCOUNTER — Ambulatory Visit: Payer: Managed Care, Other (non HMO)

## 2016-08-02 ENCOUNTER — Ambulatory Visit (INDEPENDENT_AMBULATORY_CARE_PROVIDER_SITE_OTHER): Payer: 59 | Admitting: Urgent Care

## 2016-08-02 VITALS — BP 130/100 | HR 73 | Temp 98.2°F | Resp 16 | Ht 73.5 in | Wt 193.6 lb

## 2016-08-02 DIAGNOSIS — R358 Other polyuria: Secondary | ICD-10-CM

## 2016-08-02 DIAGNOSIS — R252 Cramp and spasm: Secondary | ICD-10-CM

## 2016-08-02 DIAGNOSIS — R3589 Other polyuria: Secondary | ICD-10-CM

## 2016-08-02 DIAGNOSIS — R739 Hyperglycemia, unspecified: Secondary | ICD-10-CM | POA: Diagnosis not present

## 2016-08-02 DIAGNOSIS — E119 Type 2 diabetes mellitus without complications: Secondary | ICD-10-CM

## 2016-08-02 LAB — POCT CBC
Granulocyte percent: 43.8 %G (ref 37–80)
HEMATOCRIT: 44.6 % (ref 43.5–53.7)
Hemoglobin: 15.5 g/dL (ref 14.1–18.1)
LYMPH, POC: 3.8 — AB (ref 0.6–3.4)
MCH, POC: 28.4 pg (ref 27–31.2)
MCHC: 34.8 g/dL (ref 31.8–35.4)
MCV: 81.7 fL (ref 80–97)
MID (cbc): 0.3 (ref 0–0.9)
MPV: 8.9 fL (ref 0–99.8)
POC GRANULOCYTE: 3.2 (ref 2–6.9)
POC LYMPH %: 52 % — AB (ref 10–50)
POC MID %: 4.2 %M (ref 0–12)
Platelet Count, POC: 248 10*3/uL (ref 142–424)
RBC: 5.45 M/uL (ref 4.69–6.13)
RDW, POC: 14.7 %
WBC: 7.3 10*3/uL (ref 4.6–10.2)

## 2016-08-02 LAB — POCT GLYCOSYLATED HEMOGLOBIN (HGB A1C)

## 2016-08-02 LAB — COMPLETE METABOLIC PANEL WITH GFR
ALT: 32 U/L (ref 9–46)
AST: 21 U/L (ref 10–40)
Albumin: 5.1 g/dL (ref 3.6–5.1)
Alkaline Phosphatase: 72 U/L (ref 40–115)
BILIRUBIN TOTAL: 0.6 mg/dL (ref 0.2–1.2)
BUN: 9 mg/dL (ref 7–25)
CO2: 28 mmol/L (ref 20–31)
CREATININE: 1.11 mg/dL (ref 0.60–1.35)
Calcium: 10.4 mg/dL — ABNORMAL HIGH (ref 8.6–10.3)
Chloride: 97 mmol/L — ABNORMAL LOW (ref 98–110)
GFR, Est African American: >89 mL/min (ref >=60)
GFR, Est Non African American: 85 mL/min (ref >=60)
GLUCOSE: 271 mg/dL — AB (ref 65–99)
Potassium: 3.9 mmol/L (ref 3.5–5.3)
SODIUM: 137 mmol/L (ref 135–146)
TOTAL PROTEIN: 8.1 g/dL (ref 6.1–8.1)

## 2016-08-02 LAB — POCT URINALYSIS DIP (MANUAL ENTRY)
BILIRUBIN UA: NEGATIVE
Glucose, UA: >=1000 — AB
Leukocytes, UA: NEGATIVE
NITRITE UA: NEGATIVE
PH UA: 5
Protein Ur, POC: NEGATIVE
RBC UA: NEGATIVE
Spec Grav, UA: 1.02
UROBILINOGEN UA: 0.2

## 2016-08-02 LAB — CK: CK TOTAL: 133 U/L (ref 7–232)

## 2016-08-02 LAB — GLUCOSE, POCT (MANUAL RESULT ENTRY): POC Glucose: 255 mg/dl — AB (ref 70–99)

## 2016-08-02 LAB — LIPID PANEL
Cholesterol: 172 mg/dL (ref 125–200)
HDL: 48 mg/dL (ref >=40)
LDL CALC: 94 mg/dL (ref <130)
Total CHOL/HDL Ratio: 3.6 Ratio (ref <=5.0)
Triglycerides: 151 mg/dL — ABNORMAL HIGH (ref <150)
VLDL: 30 mg/dL (ref <30)

## 2016-08-02 MED ORDER — ATORVASTATIN CALCIUM 80 MG PO TABS: 80.0000 mg | ORAL_TABLET | Freq: Every day | ORAL | 1 refills | Status: AC

## 2016-08-02 MED ORDER — GLIPIZIDE 5 MG PO TABS: 5.0000 mg | ORAL_TABLET | Freq: Two times a day (BID) | ORAL | 3 refills | Status: DC

## 2016-08-02 MED ORDER — METFORMIN HCL 1000 MG PO TABS: 1000.0000 mg | ORAL_TABLET | Freq: Two times a day (BID) | ORAL | 1 refills | Status: DC

## 2016-08-02 NOTE — Progress Notes (Signed)
MRN: [id number] DOB: 02/28/1981  Subjective:   Adrian Mcdaniel is a 35 y.o. male presenting for chief complaint of Diabetes (fasting glucose check)  Reports elevated blood sugar to the 200's. Admits polyuria and has had muscle cramps lately. 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 States that he was told to decrease his Metformin to 1,026m once daily. Has not had f/u. He is dieting and trying to lose weight and is exercising. However, admits that he has been eating a lot of rice, brown rice daily. Denies blurred vision, polydipsia, chest pain, heart racing, n/v, abdominal pain, hematuria, numbness and tingling of hands or feet, skin infections.   WBaylerhas a current medication list which includes the following prescription(s): atorvastatin, blood glucose meter kit and supplies, and metformin. Also has No Known Allergies.  [name] has a past medical history of Diabetes mellitus without complication ([place] and Hyperlipidemia. Also  has no past surgical history on file.  Objective:   Vitals: BP (!) 130/100 (BP Location: Right Arm, Patient Position: Sitting, Cuff Size: Large)   Pulse 73   Temp 98.2 F (36.8 C) (Oral)   Resp 16   Ht 6' 1.5" (1.867 m)   Wt 193 lb 9.6 oz (87.8 kg)   SpO2 98%   BMI 25.20 kg/m   Physical Exam  Constitutional: He is oriented to person, place, and time. He appears well-developed and well-nourished.  HENT:  Mouth/Throat: Oropharynx is clear and moist.  Eyes: Right eye exhibits no discharge. Left eye exhibits no discharge. No scleral icterus.  Cardiovascular: Normal rate, regular rhythm and intact distal pulses.  Exam reveals no gallop and no friction rub.   No murmur heard. Pulmonary/Chest: No respiratory distress. He has no wheezes. He has no rales.  Abdominal: Soft. Bowel sounds are normal. He exhibits no distension and no mass. There is no tenderness. There is no guarding.  Neurological: He is alert and oriented to person, place, and time.    Skin: Skin is warm and dry.   Results for orders placed or performed in visit on 08/02/16 (from the past 24 hour(s))  POCT glucose (manual entry)     Status: Abnormal   Collection Time: 08/02/16  1:45 PM  Result Value Ref Range   POC Glucose 255 (A) 70 - 99 mg/dl  POCT urinalysis dipstick     Status: Abnormal   Collection Time: 08/02/16  1:47 PM  Result Value Ref Range   Color, UA yellow yellow   Clarity, UA clear clear   Glucose, UA >=1,000 (A) negative   Bilirubin, UA negative negative   Ketones, POC UA trace (5) (A) negative   Spec Grav, UA 1.020    Blood, UA negative negative   pH, UA 5.0    Protein Ur, POC negative negative   Urobilinogen, UA 0.2    Nitrite, UA Negative Negative   Leukocytes, UA Negative Negative  POCT glycosylated hemoglobin (Hb A1C)     Status: None   Collection Time: 08/02/16  1:50 PM  Result Value Ref Range   Hemoglobin A1C >14.0   POCT CBC     Status: Abnormal   Collection Time: 08/02/16  1:56 PM  Result Value Ref Range   WBC 7.3 4.6 - 10.2 K/uL   Lymph, poc 3.8 (A) 0.6 - 3.4   POC LYMPH PERCENT 52.0 (A) 10 - 50 %L   MID (cbc) 0.3 0 - 0.9   POC MID % 4.2 0 - 12 %M   POC  Granulocyte 3.2 2 - 6.9   Granulocyte percent 43.8 37 - 80 %G   RBC 5.45 4.69 - 6.13 M/uL   Hemoglobin 15.5 14.1 - 18.1 g/dL   HCT, POC 44.6 43.5 - 53.7 %   MCV 81.7 80 - 97 fL   MCH, POC 28.4 27 - 31.2 pg   MCHC 34.8 31.8 - 35.4 g/dL   RDW, POC 14.7 %   Platelet Count, POC 248 142 - 424 K/uL   MPV 8.9 0 - 99.8 fL   Diabetic Foot Form - Detailed   Diabetic Foot Exam - detailed Diabetic Foot exam was performed with the following findings:  Yes   Is there a history of foot ulcer?:  No Can the patient see the bottom of their feet?:  Yes Are the shoes appropriate in style and fit?:  Yes Is there swelling or and abnormal foot shape?:  No Are the toenails long?:  No Are the toenails thick?:  No Do you have pain in calf while walking?:  No Is there a claw toe deformity?:   No Is there elevated skin temparature?:  No Is there limited skin dorsiflexion?:  No Is there foot or ankle muscle weakness?:  No Are the toenails ingrown?:  No Normal Range of Motion:  Yes Pulse Foot Exam completed.:  Yes  Right posterior Tibialias:  Present Left posterior Tibialias:  Present  Right Dorsalis Pedis:  Present Left Dorsalis Pedis:  Present  Sensory Foot Exam Completed.:  Yes Semmes-Weinstein Monofilament Test   Comments:  Foot exam normal    Assessment and Plan :   1. Type 2 diabetes mellitus without complication, without long-term current use of insulin ([name]) 2. Polyuria 3. Muscle cramps 4. Hyperglycemia - Diabetes is uncontrolled secondary to non-compliance with diet and medical therapy. I discussed this with patient and he refused insulin. Will increase Metformin to 1,025m BID, add glipizide. Patient will check blood sugar daily, rtc in 4 weeks. If his blood sugar remains elevated (>200), patient agreed to start insulin. Ambulatory referral to Nutrition and Diabetic Education is pending.    MJaynee Eagles PA-C [facility] [phone]/12/2015 1:12 PM

## 2016-08-02 NOTE — Patient Instructions (Addendum)
Diabetes Mellitus and Food It is important for you to manage your blood sugar (glucose) level. Your blood glucose level can be greatly affected by what you eat. Eating healthier foods in the appropriate amounts throughout the day at about the same time each day will help you control your blood glucose level. It can also help slow or prevent worsening of your diabetes mellitus. Healthy eating may even help you improve the level of your blood pressure and reach or maintain a healthy weight.  General recommendations for healthful eating and cooking habits include:  Eating meals and snacks regularly. Avoid going long periods of time without eating to lose weight.  Eating a diet that consists mainly of plant-based foods, such as fruits, vegetables, nuts, legumes, and whole grains.  Using low-heat cooking methods, such as baking, instead of high-heat cooking methods, such as deep frying. Work with your dietitian to make sure you understand how to use the Nutrition Facts information on food labels. HOW CAN FOOD AFFECT ME? Carbohydrates Carbohydrates affect your blood glucose level more than any other type of food. Your dietitian will help you determine how many carbohydrates to eat at each meal and teach you how to count carbohydrates. Counting carbohydrates is important to keep your blood glucose at a healthy level, especially if you are using insulin or taking certain medicines for diabetes mellitus. Alcohol Alcohol can cause sudden decreases in blood glucose (hypoglycemia), especially if you use insulin or take certain medicines for diabetes mellitus. Hypoglycemia can be a life-threatening condition. Symptoms of hypoglycemia (sleepiness, dizziness, and disorientation) are similar to symptoms of having too much alcohol.  If your health care provider has given you approval to drink alcohol, do so in moderation and use the following guidelines:  Women should not have more than one drink per day, and men  should not have more than two drinks per day. One drink is equal to:  12 oz of beer.  5 oz of wine.  1 oz of hard liquor.  Do not drink on an empty stomach.  Keep yourself hydrated. Have water, diet soda, or unsweetened iced tea.  Regular soda, juice, and other mixers might contain a lot of carbohydrates and should be counted. WHAT FOODS ARE NOT RECOMMENDED? As you make food choices, it is important to remember that all foods are not the same. Some foods have fewer nutrients per serving than other foods, even though they might have the same number of calories or carbohydrates. It is difficult to get your body what it needs when you eat foods with fewer nutrients. Examples of foods that you should avoid that are high in calories and carbohydrates but low in nutrients include:  Trans fats (most processed foods list trans fats on the Nutrition Facts label).  Regular soda.  Juice.  Candy.  Sweets, such as cake, pie, doughnuts, and cookies.  Fried foods. WHAT FOODS CAN I EAT? Eat nutrient-rich foods, which will nourish your body and keep you healthy. The food you should eat also will depend on several factors, including:  The calories you need.  The medicines you take.  Your weight.  Your blood glucose level.  Your blood pressure level.  Your cholesterol level. You should eat a variety of foods, including:  Protein.  Lean cuts of meat.  Proteins low in saturated fats, such as fish, egg whites, and beans. Avoid processed meats.  Fruits and vegetables.  Fruits and vegetables that may help control blood glucose levels, such as apples, mangoes, and   yams.  Dairy products.  Choose fat-free or low-fat dairy products, such as milk, yogurt, and cheese.  Grains, bread, pasta, and rice.  Choose whole grain products, such as multigrain bread, whole oats, and brown rice. These foods may help control blood pressure.  Fats.  Foods containing healthful fats, such as nuts,  avocado, olive oil, canola oil, and fish. DOES EVERYONE WITH DIABETES MELLITUS HAVE THE SAME MEAL PLAN? Because every person with diabetes mellitus is different, there is not one meal plan that works for everyone. It is very important that you meet with a dietitian who will help you create a meal plan that is just right for you.   This information is not intended to replace advice given to you by your health care provider. Make sure you discuss any questions you have with your health care provider.   Document Released: 06/12/2005 Document Revised: 10/06/2014 Document Reviewed: 08/12/2013 Elsevier Interactive Patient Education 2016 [employer]     IF you received an x-ray today, you will receive an invoice from [facility]. Please contact [facility] at [phone] with questions or concerns regarding your invoice.   IF you received labwork today, you will receive an invoice from [facility]. Please contact [facility] at [phone] with questions or concerns regarding your invoice.   Our billing staff will not be able to assist you with questions regarding bills from these companies.  You will be contacted with the lab results as soon as they are available. The fastest way to get your results is to activate your My Chart account. Instructions are located on the last page of this paperwork. If you have not heard from us regarding the results in 2 weeks, please contact this office.      

## 2016-08-06 ENCOUNTER — Encounter: Payer: Self-pay | Admitting: Urgent Care

## 2016-09-02 ENCOUNTER — Other Ambulatory Visit: Payer: Self-pay

## 2016-09-02 MED ORDER — GLIPIZIDE 5 MG PO TABS: 5.0000 mg | ORAL_TABLET | Freq: Two times a day (BID) | ORAL | 0 refills | Status: AC

## 2017-02-01 ENCOUNTER — Other Ambulatory Visit: Payer: Self-pay | Admitting: Urgent Care

## 2017-02-01 DIAGNOSIS — E119 Type 2 diabetes mellitus without complications: Secondary | ICD-10-CM

## 2017-04-07 LAB — HM DIABETES EYE EXAM
